# Patient Record
Sex: Male | Born: 1955 | Race: White | Hispanic: No | Marital: Single | Smoking: Current every day smoker
Health system: Southern US, Community
[De-identification: ages and names within clinical notes are randomized; demographics above are authoritative.]

## PROBLEM LIST (undated history)

## (undated) DIAGNOSIS — M1990 Unspecified osteoarthritis, unspecified site: Secondary | ICD-10-CM

## (undated) DIAGNOSIS — I10 Essential (primary) hypertension: Secondary | ICD-10-CM

## (undated) DIAGNOSIS — E785 Hyperlipidemia, unspecified: Secondary | ICD-10-CM

## (undated) DIAGNOSIS — J189 Pneumonia, unspecified organism: Secondary | ICD-10-CM

## (undated) HISTORY — DX: Hyperlipidemia, unspecified: E78.5

## (undated) HISTORY — DX: Essential (primary) hypertension: I10

## (undated) HISTORY — PX: BUNIONECTOMY: SHX129

## (undated) HISTORY — PX: KNEE ARTHROSCOPY: SUR90

---

## 2001-11-08 ENCOUNTER — Encounter: Admission: RE | Admit: 2001-11-08 | Discharge: 2001-11-08 | Payer: Self-pay | Admitting: Family Medicine

## 2001-11-08 ENCOUNTER — Encounter: Payer: Self-pay | Admitting: Family Medicine

## 2001-11-24 ENCOUNTER — Encounter: Payer: Self-pay | Admitting: Urology

## 2001-11-24 ENCOUNTER — Encounter: Admission: RE | Admit: 2001-11-24 | Discharge: 2001-11-24 | Payer: Self-pay | Admitting: Urology

## 2005-01-20 ENCOUNTER — Ambulatory Visit (HOSPITAL_COMMUNITY): Admission: RE | Admit: 2005-01-20 | Discharge: 2005-01-20 | Payer: Self-pay

## 2009-12-31 ENCOUNTER — Inpatient Hospital Stay (HOSPITAL_COMMUNITY): Admission: EM | Admit: 2009-12-31 | Discharge: 2010-01-01 | Payer: Self-pay | Admitting: Emergency Medicine

## 2009-12-31 ENCOUNTER — Ambulatory Visit: Payer: Self-pay | Admitting: Family Medicine

## 2010-01-01 ENCOUNTER — Encounter: Payer: Self-pay | Admitting: Family Medicine

## 2010-11-25 LAB — URINALYSIS, ROUTINE W REFLEX MICROSCOPIC
Glucose, UA: NEGATIVE mg/dL
Hgb urine dipstick: NEGATIVE
Ketones, ur: NEGATIVE mg/dL
Protein, ur: NEGATIVE mg/dL

## 2010-11-25 LAB — BASIC METABOLIC PANEL
BUN: 94 mg/dL — ABNORMAL HIGH (ref 6–23)
CO2: 20 mEq/L (ref 19–32)
Chloride: 110 mEq/L (ref 96–112)
Chloride: 113 mEq/L — ABNORMAL HIGH (ref 96–112)
Creatinine, Ser: 2.55 mg/dL — ABNORMAL HIGH (ref 0.4–1.5)
Creatinine, Ser: 3.08 mg/dL — ABNORMAL HIGH (ref 0.4–1.5)
GFR calc Af Amer: 32 mL/min — ABNORMAL LOW (ref >60)
GFR calc non Af Amer: 21 mL/min — ABNORMAL LOW (ref >60)
Glucose, Bld: 102 mg/dL — ABNORMAL HIGH (ref 70–99)
Potassium: 4.8 mEq/L (ref 3.5–5.1)

## 2010-11-25 LAB — RENAL FUNCTION PANEL
CO2: 20 mEq/L (ref 19–32)
Calcium: 8.9 mg/dL (ref 8.4–10.5)
Chloride: 112 mEq/L (ref 96–112)
GFR calc non Af Amer: 31 mL/min — ABNORMAL LOW (ref >60)
Glucose, Bld: 98 mg/dL (ref 70–99)
Sodium: 140 mEq/L (ref 135–145)

## 2010-11-25 LAB — DIFFERENTIAL
Basophils Absolute: 0.1 10*3/uL (ref 0.0–0.1)
Basophils Relative: 1 % (ref 0–1)
Eosinophils Absolute: 0.1 10*3/uL (ref 0.0–0.7)
Neutrophils Relative %: 70 % (ref 43–77)

## 2010-11-25 LAB — COMPREHENSIVE METABOLIC PANEL
ALT: 16 U/L (ref 0–53)
Alkaline Phosphatase: 58 U/L (ref 39–117)
CO2: 17 mEq/L — ABNORMAL LOW (ref 19–32)
Calcium: 9.7 mg/dL (ref 8.4–10.5)
GFR calc non Af Amer: 17 mL/min — ABNORMAL LOW (ref >60)
Glucose, Bld: 120 mg/dL — ABNORMAL HIGH (ref 70–99)
Potassium: 5.5 mEq/L — ABNORMAL HIGH (ref 3.5–5.1)
Sodium: 136 mEq/L (ref 135–145)

## 2010-11-25 LAB — OSMOLALITY, URINE: Osmolality, Ur: 362 mOsm/kg — ABNORMAL LOW (ref 390–1090)

## 2010-11-25 LAB — CBC
Hemoglobin: 13 g/dL (ref 13.0–17.0)
MCHC: 35.1 g/dL (ref 30.0–36.0)
RBC: 3.69 MIL/uL — ABNORMAL LOW (ref 4.22–5.81)

## 2010-11-25 LAB — POCT CARDIAC MARKERS: Myoglobin, poc: 228 ng/mL (ref 12–200)

## 2010-11-25 LAB — SODIUM, URINE, RANDOM: Sodium, Ur: 33 mEq/L

## 2010-11-25 LAB — CARDIAC PANEL(CRET KIN+CKTOT+MB+TROPI): Troponin I: 0.02 ng/mL (ref 0.00–0.06)

## 2010-11-25 LAB — URINE CULTURE: Colony Count: 15000

## 2011-12-12 ENCOUNTER — Other Ambulatory Visit: Payer: Self-pay | Admitting: Family Medicine

## 2012-01-04 ENCOUNTER — Ambulatory Visit (INDEPENDENT_AMBULATORY_CARE_PROVIDER_SITE_OTHER): Payer: BC Managed Care – PPO | Admitting: Internal Medicine

## 2012-01-04 VITALS — BP 162/87 | HR 78 | Temp 98.2°F | Resp 16 | Ht 70.5 in | Wt 262.2 lb

## 2012-01-04 DIAGNOSIS — R739 Hyperglycemia, unspecified: Secondary | ICD-10-CM

## 2012-01-04 DIAGNOSIS — E785 Hyperlipidemia, unspecified: Secondary | ICD-10-CM

## 2012-01-04 DIAGNOSIS — Z6837 Body mass index (BMI) 37.0-37.9, adult: Secondary | ICD-10-CM | POA: Insufficient documentation

## 2012-01-04 DIAGNOSIS — R7309 Other abnormal glucose: Secondary | ICD-10-CM

## 2012-01-04 DIAGNOSIS — I10 Essential (primary) hypertension: Secondary | ICD-10-CM | POA: Insufficient documentation

## 2012-01-04 LAB — POCT CBC
HCT, POC: 45.3 % (ref 43.5–53.7)
Lymph, poc: 1.9 (ref 0.6–3.4)
MCH, POC: 32.2 pg — AB (ref 27–31.2)
MCHC: 32.2 g/dL (ref 31.8–35.4)
MCV: 100.1 fL — AB (ref 80–97)
MID (cbc): 1 — AB (ref 0–0.9)
POC LYMPH PERCENT: 13 %L (ref 10–50)
Platelet Count, POC: 316 10*3/uL (ref 142–424)
RDW, POC: 14 %
WBC: 14.9 10*3/uL — AB (ref 4.6–10.2)

## 2012-01-04 LAB — COMPREHENSIVE METABOLIC PANEL
AST: 18 U/L (ref 0–37)
Albumin: 4.4 g/dL (ref 3.5–5.2)
Alkaline Phosphatase: 100 U/L (ref 39–117)
BUN: 18 mg/dL (ref 6–23)
Creat: 1 mg/dL (ref 0.50–1.35)
Glucose, Bld: 114 mg/dL — ABNORMAL HIGH (ref 70–99)

## 2012-01-04 LAB — LIPID PANEL
HDL: 56 mg/dL (ref >39)
LDL Cholesterol: 156 mg/dL — ABNORMAL HIGH (ref 0–99)
Total CHOL/HDL Ratio: 4.2 Ratio
Triglycerides: 106 mg/dL (ref <150)

## 2012-01-04 LAB — POCT GLYCOSYLATED HEMOGLOBIN (HGB A1C): Hemoglobin A1C: 5.4

## 2012-01-04 LAB — PSA: PSA: 1.43 ng/mL (ref <=4.00)

## 2012-01-04 MED ORDER — AMLODIPINE BESYLATE 5 MG PO TABS: 5.0000 mg | ORAL_TABLET | Freq: Every day | ORAL | Status: DC

## 2012-01-04 MED ORDER — METOPROLOL SUCCINATE ER 50 MG PO TB24: 50.0000 mg | ORAL_TABLET | Freq: Every day | ORAL | Status: DC

## 2012-01-04 NOTE — Progress Notes (Signed)
  Subjective:    Patient ID: Raymond Moreno, male    DOB: Nov 05, 1955, 56 y.o.   MRN: [id number]  HPI Patient Active Problem List  Diagnoses  . BMI 37.0-37.9,adult  . HTN (hypertension)  . Hyperlipemia  Here for followup and med refills Last labs in May of 2011 Followup has been sporadic No complaints today   Social history: Continues to work for [employer]  Review of Systems  Constitutional: Negative for activity change, appetite change and fatigue.  HENT: Negative for hearing loss.   Eyes: Negative for visual disturbance.  Respiratory: Negative for cough, chest tightness and shortness of breath.        Continues to smoke Not ready to quit  Cardiovascular: Negative for chest pain, palpitations and leg swelling.  Gastrointestinal: Negative for abdominal pain, diarrhea and constipation.  Genitourinary: Negative for frequency and difficulty urinating.       Objective:   Physical Exam  Constitutional: He is oriented to person, place, and time.       BMI 37  Eyes: Conjunctivae and EOM are normal. Pupils are equal, round, and reactive to light.  Neck: No thyromegaly present.  Cardiovascular: Normal rate, regular rhythm, normal heart sounds and intact distal pulses.   No murmur heard. Pulmonary/Chest: Effort normal and breath sounds normal.  Musculoskeletal: He exhibits no edema.  Lymphadenopathy:    He has no cervical adenopathy.  Neurological: He is alert and oriented to person, place, and time.          Results for orders placed in visit on 01/04/12  POCT GLYCOSYLATED HEMOGLOBIN (HGB A1C)      Component Value Range   Hemoglobin A1C 5.4    POCT CBC      Component Value Range   WBC 14.9 (*) 4.6 - 10.2 (K/uL)   Lymph, poc 1.9  0.6 - 3.4    POC LYMPH PERCENT 13.0  10 - 50 (%L)   MID (cbc) 1.0 (*) 0 - 0.9    POC MID % 6.6  0 - 12 (%M)   POC Granulocyte 12.0 (*) 2 - 6.9    Granulocyte percent 80.4 (*) 37 - 80 (%G)   RBC 4.53 (*) 4.69 - 6.13 (M/uL)   Hemoglobin 14.6   14.1 - 18.1 (g/dL)   HCT, POC 40.9  81.1 - 53.7 (%)   MCV 100.1 (*) 80 - 97 (fL)   MCH, POC 32.2 (*) 27 - 31.2 (pg)   MCHC 32.2  31.8 - 35.4 (g/dL)   RDW, POC 91.4     Platelet Count, POC 316  142 - 424 (K/uL)   MPV 7.9  0 - 99.8 (fL)    Assessment & Plan:   Patient Active Problem List  Diagnoses  . BMI 37.0-37.9,adult  . HTN (hypertension)  . Hyperlipemia    -  BMI 37   - Hx Hyperglycemia-Hemoglobin A1c normal  Routine labs including PSA ordered Amlodipine and metoprolol refills He needs a routine physical examination this year and consideration for health maintenance issues like colonoscopy Recommend significant weight loss and cessation of smoking

## 2012-01-06 ENCOUNTER — Encounter: Payer: Self-pay | Admitting: Internal Medicine

## 2012-07-07 ENCOUNTER — Other Ambulatory Visit: Payer: Self-pay | Admitting: Internal Medicine

## 2012-08-20 ENCOUNTER — Other Ambulatory Visit: Payer: Self-pay | Admitting: Internal Medicine

## 2012-08-20 NOTE — Telephone Encounter (Signed)
Needs OV, was due for CPE in Oct 2013

## 2012-08-21 ENCOUNTER — Other Ambulatory Visit: Payer: Self-pay | Admitting: Physician Assistant

## 2012-09-27 ENCOUNTER — Ambulatory Visit (INDEPENDENT_AMBULATORY_CARE_PROVIDER_SITE_OTHER): Payer: BC Managed Care – PPO | Admitting: Family Medicine

## 2012-09-27 VITALS — BP 177/90 | HR 69 | Temp 98.4°F | Resp 16 | Ht 72.5 in | Wt 269.6 lb

## 2012-09-27 DIAGNOSIS — I10 Essential (primary) hypertension: Secondary | ICD-10-CM

## 2012-09-27 DIAGNOSIS — Z23 Encounter for immunization: Secondary | ICD-10-CM

## 2012-09-27 DIAGNOSIS — Z0000 Encounter for general adult medical examination without abnormal findings: Secondary | ICD-10-CM

## 2012-09-27 LAB — POCT URINALYSIS DIPSTICK
Bilirubin, UA: NEGATIVE
Glucose, UA: NEGATIVE
Ketones, UA: NEGATIVE
Leukocytes, UA: NEGATIVE
Nitrite, UA: NEGATIVE
Spec Grav, UA: 1.02
Urobilinogen, UA: 0.2
pH, UA: 7

## 2012-09-27 LAB — COMPREHENSIVE METABOLIC PANEL
ALT: 13 U/L (ref 0–53)
AST: 17 U/L (ref 0–37)
Albumin: 4.5 g/dL (ref 3.5–5.2)
Alkaline Phosphatase: 87 U/L (ref 39–117)
BUN: 13 mg/dL (ref 6–23)
CO2: 28 mEq/L (ref 19–32)
Calcium: 9.8 mg/dL (ref 8.4–10.5)
Chloride: 106 mEq/L (ref 96–112)
Creat: 0.87 mg/dL (ref 0.50–1.35)
Glucose, Bld: 108 mg/dL — ABNORMAL HIGH (ref 70–99)
Potassium: 4.3 mEq/L (ref 3.5–5.3)
Sodium: 142 mEq/L (ref 135–145)
Total Bilirubin: 0.4 mg/dL (ref 0.3–1.2)
Total Protein: 7.4 g/dL (ref 6.0–8.3)

## 2012-09-27 LAB — LIPID PANEL
Cholesterol: 272 mg/dL — ABNORMAL HIGH (ref 0–200)
HDL: 60 mg/dL (ref >39)
LDL Cholesterol: 180 mg/dL — ABNORMAL HIGH (ref 0–99)
Total CHOL/HDL Ratio: 4.5 Ratio
Triglycerides: 162 mg/dL — ABNORMAL HIGH (ref <150)
VLDL: 32 mg/dL (ref 0–40)

## 2012-09-27 MED ORDER — METOPROLOL SUCCINATE ER 50 MG PO TB24: 50.0000 mg | ORAL_TABLET | Freq: Every day | ORAL | Status: DC

## 2012-09-27 MED ORDER — AMLODIPINE BESYLATE 5 MG PO TABS: ORAL_TABLET | ORAL | Status: DC

## 2012-09-27 NOTE — Patient Instructions (Signed)
Hypertension As your heart beats, it forces blood through your arteries. This force is your blood pressure. If the pressure is too high, it is called hypertension (HTN) or high blood pressure. HTN is dangerous because you may have it and not know it. High blood pressure may mean that your heart has to work harder to pump blood. Your arteries may be narrow or stiff. The extra work puts you at risk for heart disease, stroke, and other problems.  Blood pressure consists of two numbers, a higher number over a lower, 110/72, for example. It is stated as "110 over 72." The ideal is below 120 for the top number (systolic) and under 80 for the bottom (diastolic). Write down your blood pressure today. You should pay close attention to your blood pressure if you have certain conditions such as:  Heart failure.  Prior heart attack.  Diabetes  Chronic kidney disease.  Prior stroke.  Multiple risk factors for heart disease. To see if you have HTN, your blood pressure should be measured while you are seated with your arm held at the level of the heart. It should be measured at least twice. A one-time elevated blood pressure reading (especially in the Emergency Department) does not mean that you need treatment. There may be conditions in which the blood pressure is different between your right and left arms. It is important to see your caregiver soon for a recheck. Most people have essential hypertension which means that there is not a specific cause. This type of high blood pressure may be lowered by changing lifestyle factors such as:  Stress.  Smoking.  Lack of exercise.  Excessive weight.  Drug/tobacco/alcohol use.  Eating less salt. Most people do not have symptoms from high blood pressure until it has caused damage to the body. Effective treatment can often prevent, delay or reduce that damage. TREATMENT  When a cause has been identified, treatment for high blood pressure is directed at the  cause. There are a large number of medications to treat HTN. These fall into several categories, and your caregiver will help you select the medicines that are best for you. Medications may have side effects. You should review side effects with your caregiver. If your blood pressure stays high after you have made lifestyle changes or started on medicines,   Your medication(s) may need to be changed.  Other problems may need to be addressed.  Be certain you understand your prescriptions, and know how and when to take your medicine.  Be sure to follow up with your caregiver within the time frame advised (usually within two weeks) to have your blood pressure rechecked and to review your medications.  If you are taking more than one medicine to lower your blood pressure, make sure you know how and at what times they should be taken. Taking two medicines at the same time can result in blood pressure that is too low. SEEK IMMEDIATE MEDICAL CARE IF:  You develop a severe headache, blurred or changing vision, or confusion.  You have unusual weakness or numbness, or a faint feeling.  You have severe chest or abdominal pain, vomiting, or breathing problems. MAKE SURE YOU:   Understand these instructions.  Will watch your condition.  Will get help right away if you are not doing well or get worse. Document Released: 08/24/2005 Document Revised: 11/16/2011 Document Reviewed: 04/13/2008 Exit[employer] Patient Information 2013 [employer]. Health Maintenance, Males A healthy lifestyle and preventative care can promote health and wellness.  Maintain   regular health, dental, and eye exams.  Eat a healthy diet. Foods like vegetables, fruits, whole grains, low-fat dairy products, and lean protein foods contain the nutrients you need without too many calories. Decrease your intake of foods high in solid fats, added sugars, and salt. Get information about a proper diet from your caregiver, if  necessary.  Regular physical exercise is one of the most important things you can do for your health. Most adults should get at least 150 minutes of moderate-intensity exercise (any activity that increases your heart rate and causes you to sweat) each week. In addition, most adults need muscle-strengthening exercises on 2 or more days a week.   Maintain a healthy weight. The body mass index (BMI) is a screening tool to identify possible weight problems. It provides an estimate of body fat based on height and weight. Your caregiver can help determine your BMI, and can help you achieve or maintain a healthy weight. For adults 20 years and older:  A BMI below 18.5 is considered underweight.  A BMI of 18.5 to 24.9 is normal.  A BMI of 25 to 29.9 is considered overweight.  A BMI of 30 and above is considered obese.  Maintain normal blood lipids and cholesterol by exercising and minimizing your intake of saturated fat. Eat a balanced diet with plenty of fruits and vegetables. Blood tests for lipids and cholesterol should begin at age 20 and be repeated every 5 years. If your lipid or cholesterol levels are high, you are over 50, or you are a high risk for heart disease, you may need your cholesterol levels checked more frequently.Ongoing high lipid and cholesterol levels should be treated with medicines, if diet and exercise are not effective.  If you smoke, find out from your caregiver how to quit. If you do not use tobacco, do not start.  If you choose to drink alcohol, do not exceed 2 drinks per day. One drink is considered to be 12 ounces (355 mL) of beer, 5 ounces (148 mL) of wine, or 1.5 ounces (44 mL) of liquor.  Avoid use of street drugs. Do not share needles with anyone. Ask for help if you need support or instructions about stopping the use of drugs.  High blood pressure causes heart disease and increases the risk of stroke. Blood pressure should be checked at least every 1 to 2 years.  Ongoing high blood pressure should be treated with medicines if weight loss and exercise are not effective.  If you are 45 to 57 years old, ask your caregiver if you should take aspirin to prevent heart disease.  Diabetes screening involves taking a blood sample to check your fasting blood sugar level. This should be done once every 3 years, after age 45, if you are within normal weight and without risk factors for diabetes. Testing should be considered at a younger age or be carried out more frequently if you are overweight and have at least 1 risk factor for diabetes.  Colorectal cancer can be detected and often prevented. Most routine colorectal cancer screening begins at the age of 50 and continues through age 75. However, your caregiver may recommend screening at an earlier age if you have risk factors for colon cancer. On a yearly basis, your caregiver may provide home test kits to check for hidden blood in the stool. Use of a small camera at the end of a tube, to directly examine the colon (sigmoidoscopy or colonoscopy), can detect the earliest forms of colorectal   cancer. Talk to your caregiver about this at age 50, when routine screening begins. Direct examination of the colon should be repeated every 5 to 10 years through age 75, unless early forms of pre-cancerous polyps or small growths are found.  Hepatitis C blood testing is recommended for all people born from 1945 through 1965 and any individual with known risks for hepatitis C.  Healthy men should no longer receive prostate-specific antigen (PSA) blood tests as part of routine cancer screening. Consult with your caregiver about prostate cancer screening.  Testicular cancer screening is not recommended for adolescents or adult males who have no symptoms. Screening includes self-exam, caregiver exam, and other screening tests. Consult with your caregiver about any symptoms you have or any concerns you have about testicular  cancer.  Practice safe sex. Use condoms and avoid high-risk sexual practices to reduce the spread of sexually transmitted infections (STIs).  Use sunscreen with a sun protection factor (SPF) of 30 or greater. Apply sunscreen liberally and repeatedly throughout the day. You should seek shade when your shadow is shorter than you. Protect yourself by wearing long sleeves, pants, a wide-brimmed hat, and sunglasses year round, whenever you are outdoors.  Notify your caregiver of new moles or changes in moles, especially if there is a change in shape or color. Also notify your caregiver if a mole is larger than the size of a pencil eraser.  A one-time screening for abdominal aortic aneurysm (AAA) and surgical repair of large AAAs by sound wave imaging (ultrasonography) is recommended for ages 65 to 75 years who are current or former smokers.  Stay current with your immunizations. Document Released: 02/20/2008 Document Revised: 11/16/2011 Document Reviewed: 01/19/2011 Exit[employer] Patient Information 2013 [employer].  

## 2012-09-27 NOTE — Progress Notes (Signed)
Patient ID: Raymond Moreno MRN: [id number], DOB: 05/03/1956 57 y.o. Date of Encounter: 09/27/2012, 9:41 AM  Primary Physician: No primary provider on file.  Chief Complaint: Physical (CPE)  HPI: 57 y.o. y/o male with history noted below here for CPE.  Doing well. No issues/complaints.  Review of Systems: Consitutional: No fever, chills, fatigue, night sweats, lymphadenopathy, or weight changes. Eyes: No visual changes, eye redness, or discharge. ENT/Mouth: Ears: No otalgia, tinnitus, hearing loss, discharge. Nose: No congestion, rhinorrhea, sinus pain, or epistaxis. Throat: No sore throat, post nasal drip, or teeth pain. Cardiovascular: No CP, palpitations, diaphoresis, DOE, edema, orthopnea, PND. Respiratory: No cough, hemoptysis, SOB, or wheezing. Gastrointestinal: No anorexia, dysphagia, reflux, pain, nausea, vomiting, hematemesis, diarrhea, constipation, BRBPR, or melena. Genitourinary: No dysuria, frequency, urgency, hematuria, incontinence, nocturia, decreased urinary stream, discharge, impotence, or testicular pain/masses. Musculoskeletal: No decreased ROM, myalgias, stiffness, joint swelling, or weakness. Skin: No rash, erythema, lesion changes, pain, warmth, jaundice, or pruritis. Neurological: No headache, dizziness, syncope, seizures, tremors, memory loss, coordination problems, or paresthesias. Psychological: No anxiety, depression, hallucinations, SI/HI. Endocrine: No fatigue, polydipsia, polyphagia, polyuria, or known diabetes. All other systems were reviewed and are otherwise negative.  Past Medical History  Diagnosis Date  . Hypertension      Past Surgical History  Procedure Date  . Bunionectomy     left foot  . Knee arthroscopy     right knee    Home Meds:  Prior to Admission medications   Medication Sig Start Date End Date Taking? Authorizing Provider  amLODipine (NORVASC) 5 MG tablet One daily 09/27/12  Yes Elvina Sidle, MD  aspirin 81 MG tablet  Take 81 mg by mouth daily.   Yes Historical Provider, MD  metoprolol succinate (TOPROL-XL) 50 MG 24 hr tablet Take 1 tablet (50 mg total) by mouth daily. Need office visit for additional refills. 09/27/12  Yes Elvina Sidle, MD  Omega-3 Fatty Acids (FISH OIL) 1000 MG CAPS Take 2 capsules by mouth daily.   Yes Historical Provider, MD    Allergies: No Known Allergies  History   Social History  . Marital Status: Married    Spouse Name: N/A    Number of Children: N/A  . Years of Education: N/A   Occupational History  . Not on file.   Social History Main Topics  . Smoking status: Current Every Day Smoker  . Smokeless tobacco: Not on file  . Alcohol Use: Yes  . Drug Use: No  . Sexually Active: No   Other Topics Concern  . Not on file   Social History Narrative  . No narrative on file    Family History  Problem Relation Age of Onset  . Heart disease Mother   . Hypertension Sister     Physical Exam:  158/82 Blood pressure 177/90, pulse 69, temperature 98.4 F (36.9 C), temperature source Oral, resp. rate 16, height 6' 0.5" (1.842 m), weight 269 lb 9.6 oz (122.29 kg), SpO2 96.00%.  General: Well developed, well nourished, in no acute distress. HEENT: Normocephalic, atraumatic. Conjunctiva pink, sclera non-icteric. Pupils 2 mm constricting to 1 mm, round, regular, and equally reactive to light and accomodation. EOMI. Internal auditory canal clear. TMs with good cone of light and without pathology. Nasal mucosa pink. Nares are without discharge. No sinus tenderness. Oral mucosa pink. Dentition multiple caries with gingivitis. Pharynx without exudate.   Neck: Supple. Trachea midline. No thyromegaly. Full ROM. No lymphadenopathy. Lungs: Clear to auscultation bilaterally without wheezes, rales, or rhonchi. Breathing is  of normal effort and unlabored. Cardiovascular: RRR with S1 S2. No murmurs, rubs, or gallops appreciated. Distal pulses 2+ symmetrically. No carotid or abdominal  bruits Abdomen: Soft, non-tender, non-distended with normoactive bowel sounds. No hepatosplenomegaly or masses. No rebound/guarding. No CVA tenderness. Without hernias.   Genitourinary:  circumcised male. No penile lesions. Testes descended bilaterally, and smooth without tenderness or masses.  Musculoskeletal: Full range of motion and 5/5 strength throughout. Without swelling, atrophy, tenderness, crepitus, or warmth. Extremities without clubbing, cyanosis, or edema. Calves supple. Skin: Warm and moist without erythema, ecchymosis, wounds, or rash. Neuro: A+Ox3. CN II-XII grossly intact. Moves all extremities spontaneously. Full sensation throughout. Normal gait. DTR 2+ throughout upper and lower extremities. Finger to nose intact. Psych:  Responds to questions appropriately with a normal affect.   Studies: CBC, CMET, Lipid, PSA UA:   Assessment/Plan:  57 y.o. y/o  male here for CPE -  Signed, Elvina Sidle, MD 09/27/2012 9:41 AM

## 2012-09-28 ENCOUNTER — Telehealth: Payer: Self-pay

## 2012-09-28 NOTE — Telephone Encounter (Signed)
Pt notified. Says he has taken Simvastatin in the past and is ok with taking it again. Please send to pharm. thanks

## 2012-09-29 ENCOUNTER — Other Ambulatory Visit: Payer: Self-pay | Admitting: Family Medicine

## 2012-09-29 DIAGNOSIS — E785 Hyperlipidemia, unspecified: Secondary | ICD-10-CM

## 2012-09-29 MED ORDER — SIMVASTATIN 20 MG PO TABS: 20.0000 mg | ORAL_TABLET | Freq: Every day | ORAL | Status: DC

## 2013-10-04 ENCOUNTER — Other Ambulatory Visit: Payer: Self-pay | Admitting: Family Medicine

## 2013-10-05 ENCOUNTER — Other Ambulatory Visit: Payer: Self-pay | Admitting: Family Medicine

## 2013-10-20 ENCOUNTER — Ambulatory Visit: Payer: BC Managed Care – PPO

## 2013-10-20 ENCOUNTER — Ambulatory Visit (INDEPENDENT_AMBULATORY_CARE_PROVIDER_SITE_OTHER): Payer: BC Managed Care – PPO | Admitting: Emergency Medicine

## 2013-10-20 VITALS — BP 128/72 | HR 72 | Temp 98.2°F | Resp 18 | Ht 72.5 in | Wt 260.0 lb

## 2013-10-20 DIAGNOSIS — M25539 Pain in unspecified wrist: Secondary | ICD-10-CM

## 2013-10-20 DIAGNOSIS — S63502A Unspecified sprain of left wrist, initial encounter: Secondary | ICD-10-CM

## 2013-10-20 DIAGNOSIS — S63509A Unspecified sprain of unspecified wrist, initial encounter: Secondary | ICD-10-CM

## 2013-10-20 MED ORDER — HYDROCODONE-ACETAMINOPHEN 5-325 MG PO TABS: 1.0000 | ORAL_TABLET | Freq: Four times a day (QID) | ORAL | Status: DC | PRN

## 2013-10-20 NOTE — Progress Notes (Signed)
   Subjective:    Patient ID: Raymond Moreno, male    DOB: Jul 10, 1956, 58 y.o.   MRN: [id number]  HPI patient was in his usual state of health until early morning when he fell and sustained a injury to his left wrist. He now is unable to grip or move his wrist without significant pain. He is noticed significant swelling over the wrist.    Review of Systems     Objective:   Physical Exam there is significant discomfort with flexion or extension of the wrist. There is significant swelling over the scaphoid as well as over the wrist itself. Neurovascular is intact the patient is able to flex extend the fingers and there is normal filling to  [facility] reading (PRIMARY) by  Dr. Everlene Farrier.There is a tubercle present over the scaphoid. I did not see a definite fracture patient placed in a sugar tong        Assessment & Plan:   Referral made to go for orthopedics. Devra Dopp requested regarding the wrist films. He is given hydrocodone for pain. Note for work given.

## 2013-11-22 ENCOUNTER — Ambulatory Visit (INDEPENDENT_AMBULATORY_CARE_PROVIDER_SITE_OTHER): Payer: BC Managed Care – PPO | Admitting: Family Medicine

## 2013-11-22 VITALS — BP 180/96 | HR 87 | Temp 98.0°F | Resp 18 | Ht 72.05 in | Wt 261.0 lb

## 2013-11-22 DIAGNOSIS — Z91199 Patient's noncompliance with other medical treatment and regimen due to unspecified reason: Secondary | ICD-10-CM

## 2013-11-22 DIAGNOSIS — E785 Hyperlipidemia, unspecified: Secondary | ICD-10-CM

## 2013-11-22 DIAGNOSIS — I10 Essential (primary) hypertension: Secondary | ICD-10-CM

## 2013-11-22 DIAGNOSIS — Z9119 Patient's noncompliance with other medical treatment and regimen: Secondary | ICD-10-CM

## 2013-11-22 LAB — COMPREHENSIVE METABOLIC PANEL
ALT: 11 U/L (ref 0–53)
Albumin: 4.1 g/dL (ref 3.5–5.2)
BUN: 13 mg/dL (ref 6–23)
CO2: 25 mEq/L (ref 19–32)
Calcium: 9.2 mg/dL (ref 8.4–10.5)
Glucose, Bld: 95 mg/dL (ref 70–99)
Potassium: 4 mEq/L (ref 3.5–5.3)
Sodium: 141 mEq/L (ref 135–145)
Total Bilirubin: 0.4 mg/dL (ref 0.2–1.2)
Total Protein: 7.1 g/dL (ref 6.0–8.3)

## 2013-11-22 LAB — COMPREHENSIVE METABOLIC PANEL WITH GFR
AST: 19 U/L (ref 0–37)
Alkaline Phosphatase: 88 U/L (ref 39–117)
Chloride: 103 meq/L (ref 96–112)
Creat: 0.73 mg/dL (ref 0.50–1.35)

## 2013-11-22 LAB — LIPID PANEL
Cholesterol: 260 mg/dL — ABNORMAL HIGH (ref 0–200)
HDL: 60 mg/dL (ref >39)
LDL Cholesterol: 164 mg/dL — ABNORMAL HIGH (ref 0–99)
Total CHOL/HDL Ratio: 4.3 ratio
Triglycerides: 179 mg/dL — ABNORMAL HIGH (ref <150)
VLDL: 36 mg/dL (ref 0–40)

## 2013-11-22 LAB — TSH: TSH: 1.54 u[IU]/mL (ref 0.350–4.500)

## 2013-11-22 MED ORDER — SIMVASTATIN 20 MG PO TABS: 20.0000 mg | ORAL_TABLET | Freq: Every day | ORAL | Status: DC

## 2013-11-22 MED ORDER — METOPROLOL SUCCINATE ER 50 MG PO TB24: 50.0000 mg | ORAL_TABLET | Freq: Every day | ORAL | Status: DC

## 2013-11-22 MED ORDER — AMLODIPINE BESYLATE 5 MG PO TABS: 5.0000 mg | ORAL_TABLET | Freq: Every day | ORAL | Status: DC

## 2013-11-22 NOTE — Progress Notes (Signed)
Chief Complaint:  Chief Complaint  Patient presents with  . Medication Refill    needs blood work- he is fasting    HPI: Raymond Moreno is a 58 y.o. male who is here for refills of his medication. He needs refills of metoprolol, amlodipine, simvastatin. No SEs He is compliant. He does not take the simvastatin regular, he takes it about 4 times a week at most sicne he has to take it at night and is forgetful.  He is a smoker. Did not eat today. Ran out of meds.   Past Medical History  Diagnosis Date  . Hypertension   . Hyperlipidemia    Past Surgical History  Procedure Laterality Date  . Bunionectomy      left foot  . Knee arthroscopy      right knee   History   Social History  . Marital Status: Married    Spouse Name: N/A    Number of Children: N/A  . Years of Education: N/A   Social History Main Topics  . Smoking status: Current Every Day Smoker  . Smokeless tobacco: None  . Alcohol Use: Yes  . Drug Use: No  . Sexual Activity: No   Other Topics Concern  . None   Social History Narrative  . None   Family History  Problem Relation Age of Onset  . Heart disease Mother   . Hypertension Sister    No Known Allergies Prior to Admission medications   Medication Sig Start Date End Date Taking? Authorizing Provider  amLODipine (NORVASC) 5 MG tablet Take 1 tablet (5 mg total) by mouth daily. PATIENT NEEDS OFFICE VISIT FOR ADDITIONAL REFILLS 10/05/13  Yes Robyn Haber, MD  aspirin 81 MG tablet Take 81 mg by mouth daily.   Yes Historical Provider, MD  metoprolol succinate (TOPROL-XL) 50 MG 24 hr tablet Take 1 tablet (50 mg total) by mouth daily. PATIENT NEEDS OFFICE VISIT FOR ADDITIONAL REFILLS 10/05/13  Yes Robyn Haber, MD  simvastatin (ZOCOR) 20 MG tablet Take 1 tablet (20 mg total) by mouth at bedtime. PATIENT NEEDS OFFICE VISIT FOR ADDITIONAL REFILLS 10/04/13  Yes Eleanore Kurtis Bushman, PA-C     ROS: The patient denies fevers, chills, night sweats,  unintentional weight loss, chest pain, palpitations, wheezing, dyspnea on exertion, nausea, vomiting, abdominal pain, dysuria, hematuria, melena, numbness, weakness, or tingling.   All other systems have been reviewed and were otherwise negative with the exception of those mentioned in the HPI and as above.    PHYSICAL EXAM: Filed Vitals:   11/22/13 0905  BP: 180/96  Pulse: 87  Temp: 98 F (36.7 C)  Resp: 18   Filed Vitals:   11/22/13 0905  Height: 6' 0.05" (1.83 m)  Weight: 261 lb (118.389 kg)   Body mass index is 35.35 kg/(m^2).  General: Alert, no acute distress, obese HEENT:  Normocephalic, atraumatic, oropharynx patent. EOMI, PERRLA, tm nl, no exudates Cardiovascular:  Regular rate and rhythm, no rubs murmurs or gallops.  No Carotid bruits, radial pulse intact. No pedal edema.  Respiratory: Clear to auscultation bilaterally.  No wheezes, rales, or rhonchi.  No cyanosis, no use of accessory musculature GI: No organomegaly, abdomen is soft and non-tender, positive bowel sounds.  No masses. Skin: No rashes. Neurologic: Facial musculature symmetric. Psychiatric: Patient is appropriate throughout our interaction. Lymphatic: No cervical lymphadenopathy Musculoskeletal: Gait intact.   LABS: Results for orders placed in visit on 11/22/13  COMPREHENSIVE METABOLIC PANEL  Result Value Ref Range   Sodium 141  135 - 145 mEq/L   Potassium 4.0  3.5 - 5.3 mEq/L   Chloride 103  96 - 112 mEq/L   CO2 25  19 - 32 mEq/L   Glucose, Bld 95  70 - 99 mg/dL   BUN 13  6 - 23 mg/dL   Creat 0.73  0.50 - 1.35 mg/dL   Total Bilirubin 0.4  0.2 - 1.2 mg/dL   Alkaline Phosphatase 88  39 - 117 U/L   AST 19  0 - 37 U/L   ALT 11  0 - 53 U/L   Total Protein 7.1  6.0 - 8.3 g/dL   Albumin 4.1  3.5 - 5.2 g/dL   Calcium 9.2  8.4 - 10.5 mg/dL  TSH      Result Value Ref Range   TSH 1.540  0.350 - 4.500 uIU/mL  MICROALBUMIN / CREATININE URINE RATIO      Result Value Ref Range   Microalb, Ur 9.94  (*) 0.00 - 1.89 mg/dL   Creatinine, Urine 178.2     Microalb Creat Ratio 55.8 (*) 0.0 - 30.0 mg/g  LIPID PANEL      Result Value Ref Range   Cholesterol 260 (*) 0 - 200 mg/dL   Triglycerides 179 (*) <150 mg/dL   HDL 60  >39 mg/dL   Total CHOL/HDL Ratio 4.3     VLDL 36  0 - 40 mg/dL   LDL Cholesterol 164 (*) 0 - 99 mg/dL     EKG/XRAY:   Primary read interpreted by Dr. Marin Comment at [facility]   ASSESSMENT/PLAN: Encounter Diagnoses  Name Primary?  . HTN (hypertension) Yes  . Other and unspecified hyperlipidemia   . Non-compliant patient    Refilled meds Advise to take meds as prescribed Monitor BP , goal is less than 150/90 F/u in 6 month Labs pending  Gross sideeffects, risk and benefits, and alternatives of medications d/w patient. Patient is aware that all medications have potential sideeffects and we are unable to predict every sideeffect or drug-drug interaction that may occur.  LE, [place], DO 11/25/2013 11:46 PM

## 2013-11-22 NOTE — Patient Instructions (Signed)

## 2013-11-23 LAB — MICROALBUMIN / CREATININE URINE RATIO
Creatinine, Urine: 178.2 mg/dL
Microalb Creat Ratio: 55.8 mg/g — ABNORMAL HIGH (ref 0.0–30.0)
Microalb, Ur: 9.94 mg/dL — ABNORMAL HIGH (ref 0.00–1.89)

## 2013-11-25 ENCOUNTER — Encounter: Payer: Self-pay | Admitting: Family Medicine

## 2014-11-26 ENCOUNTER — Other Ambulatory Visit: Payer: Self-pay | Admitting: Family Medicine

## 2014-12-30 ENCOUNTER — Other Ambulatory Visit: Payer: Self-pay | Admitting: Family Medicine

## 2015-01-03 ENCOUNTER — Other Ambulatory Visit: Payer: Self-pay | Admitting: Family Medicine

## 2015-01-07 ENCOUNTER — Other Ambulatory Visit: Payer: Self-pay | Admitting: *Deleted

## 2015-01-07 ENCOUNTER — Ambulatory Visit (INDEPENDENT_AMBULATORY_CARE_PROVIDER_SITE_OTHER): Payer: BC Managed Care – PPO | Admitting: Internal Medicine

## 2015-01-07 VITALS — BP 124/78 | HR 70 | Temp 98.2°F | Resp 17 | Ht 72.5 in | Wt 258.0 lb

## 2015-01-07 DIAGNOSIS — F17200 Nicotine dependence, unspecified, uncomplicated: Secondary | ICD-10-CM | POA: Insufficient documentation

## 2015-01-07 DIAGNOSIS — H5462 Unqualified visual loss, left eye, normal vision right eye: Secondary | ICD-10-CM | POA: Diagnosis not present

## 2015-01-07 DIAGNOSIS — R251 Tremor, unspecified: Secondary | ICD-10-CM | POA: Insufficient documentation

## 2015-01-07 DIAGNOSIS — Z125 Encounter for screening for malignant neoplasm of prostate: Secondary | ICD-10-CM

## 2015-01-07 DIAGNOSIS — Z720 Tobacco use: Secondary | ICD-10-CM | POA: Diagnosis not present

## 2015-01-07 DIAGNOSIS — E785 Hyperlipidemia, unspecified: Secondary | ICD-10-CM | POA: Diagnosis not present

## 2015-01-07 DIAGNOSIS — I10 Essential (primary) hypertension: Secondary | ICD-10-CM

## 2015-01-07 DIAGNOSIS — Z6837 Body mass index (BMI) 37.0-37.9, adult: Secondary | ICD-10-CM

## 2015-01-07 LAB — CBC WITH DIFFERENTIAL/PLATELET
BASOS PCT: 1 % (ref 0–1)
Basophils Absolute: 0.1 10*3/uL (ref 0.0–0.1)
Eosinophils Absolute: 0.1 10*3/uL (ref 0.0–0.7)
Eosinophils Relative: 1 % (ref 0–5)
HEMATOCRIT: 44.4 % (ref 39.0–52.0)
HEMOGLOBIN: 15 g/dL (ref 13.0–17.0)
Lymphocytes Relative: 21 % (ref 12–46)
Lymphs Abs: 2.1 10*3/uL (ref 0.7–4.0)
MCH: 34.6 pg — ABNORMAL HIGH (ref 26.0–34.0)
MCHC: 33.8 g/dL (ref 30.0–36.0)
MCV: 102.5 fL — ABNORMAL HIGH (ref 78.0–100.0)
MONOS PCT: 7 % (ref 3–12)
MPV: 9.5 fL (ref 8.6–12.4)
Monocytes Absolute: 0.7 10*3/uL (ref 0.1–1.0)
NEUTROS PCT: 70 % (ref 43–77)
Neutro Abs: 7.1 10*3/uL (ref 1.7–7.7)
Platelets: 307 10*3/uL (ref 150–400)
RBC: 4.33 MIL/uL (ref 4.22–5.81)
RDW: 15.1 % (ref 11.5–15.5)
WBC: 10.1 10*3/uL (ref 4.0–10.5)

## 2015-01-07 LAB — LIPID PANEL
Cholesterol: 235 mg/dL — ABNORMAL HIGH (ref 0–200)
HDL: 68 mg/dL (ref >=40)
LDL Cholesterol: 142 mg/dL — ABNORMAL HIGH (ref 0–99)
Total CHOL/HDL Ratio: 3.5 Ratio
Triglycerides: 124 mg/dL (ref <150)
VLDL: 25 mg/dL (ref 0–40)

## 2015-01-07 LAB — COMPREHENSIVE METABOLIC PANEL
ALT: 13 U/L (ref 0–53)
AST: 21 U/L (ref 0–37)
Albumin: 4.3 g/dL (ref 3.5–5.2)
Alkaline Phosphatase: 92 U/L (ref 39–117)
BILIRUBIN TOTAL: 0.4 mg/dL (ref 0.2–1.2)
BUN: 28 mg/dL — AB (ref 6–23)
CALCIUM: 9.2 mg/dL (ref 8.4–10.5)
CO2: 24 meq/L (ref 19–32)
CREATININE: 1.07 mg/dL (ref 0.50–1.35)
Chloride: 103 mEq/L (ref 96–112)
GLUCOSE: 82 mg/dL (ref 70–99)
POTASSIUM: 4.9 meq/L (ref 3.5–5.3)
Sodium: 142 mEq/L (ref 135–145)
Total Protein: 7.9 g/dL (ref 6.0–8.3)

## 2015-01-07 LAB — TSH: TSH: 0.91 u[IU]/mL (ref 0.350–4.500)

## 2015-01-07 MED ORDER — AMLODIPINE BESYLATE 5 MG PO TABS: 5.0000 mg | ORAL_TABLET | Freq: Every day | ORAL | Status: DC

## 2015-01-07 MED ORDER — ATORVASTATIN CALCIUM 40 MG PO TABS: 40.0000 mg | ORAL_TABLET | Freq: Every day | ORAL | Status: DC

## 2015-01-07 MED ORDER — AMLODIPINE BESYLATE 5 MG PO TABS: ORAL_TABLET | ORAL | Status: DC

## 2015-01-07 MED ORDER — METOPROLOL SUCCINATE ER 50 MG PO TB24: 50.0000 mg | ORAL_TABLET | Freq: Every day | ORAL | Status: DC

## 2015-01-07 NOTE — Progress Notes (Signed)
Subjective:    Patient ID: Raymond Moreno, male    DOB: [birth year], 59 y.o.   MRN: [id number]  HPI Chief Complaint  Patient presents with   Medication Refill    norvasc,toprol,zocor   This chart was scribed for Tami Lin, MD by Thea Alken, ED Scribe. This patient was seen in room 3 and the patient's care was started at 2:43 PM.  HPI Comments: Raymond Moreno is a 59 y.o. male who presents to the [facility] for medication refills  Pt had elevated BP and cholesterol last year during last blood check and states he was not consistently taking his medication during that time. He doesn't come in regularly and tries to stretch his medicine for a longer period of time. He is unsure why he is on metoprolol however Pt reports occasional tremors in hands noticed when he is writing and typing while at work--present for many years and not getting worse.  He denies  tremors with eating and drinking.  no family hx of tremors.   Pt denies CP, SOB, trouble sleeping, and indigestion.    Pt reports left eye blurriness and has trouble seeing long distance in left eye. Pt admits to not seeing optometrist in several years but reports his relative Marcene Duos is his optometrist. He denies trouble seeing out of right. No testis was present at last years visit  Pt reports last TDAP was in 2012. Pt has not had a colonoscopy and does not want a screening done. He is unknowledgeable of hx of colon cancer.  Still smoking and not ready to quit   Works as a Press photographer  Even though chart indicates the need for tetanus coverage he had this in 2012  Past Medical History  Diagnosis Date   Hypertension    Hyperlipidemia    Past Surgical History  Procedure Laterality Date   Bunionectomy      left foot   Knee arthroscopy      right knee   Prior to Admission medications   Medication Sig Start Date End Date Taking? Authorizing  Provider  amLODipine (NORVASC) 5 MG tablet NO MORE REFILLS 3RD 01/07/15  Yes Mancel Bale, PA-C  aspirin 81 MG tablet Take 81 mg by mouth daily.   Yes Historical Provider, MD  metoprolol succinate (TOPROL-XL) 50 MG 24 hr tablet Take 1 tablet (50 mg total) by mouth daily. NO MORE REFILLS WITHOUT OFFICE VISIT - 2ND NOTICE 12/31/14  Yes Dorian Heckle English, PA  simvastatin (ZOCOR) 20 MG tablet Take 1 tablet (20 mg total) by mouth at bedtime. PATIENT NEEDS OFFICE VISIT/FASTING LABS FOR ADDITIONAL REFILLS 11/27/14  Yes Wardell Honour, MD   Review of Systems  Constitutional: Negative for activity change, fatigue and unexpected weight change.  Eyes: Positive for visual disturbance.  Respiratory: Negative for cough, choking, chest tightness, shortness of breath and wheezing.   Cardiovascular: Negative for chest pain, palpitations and leg swelling.  Gastrointestinal: Negative for abdominal pain, diarrhea and constipation.  Genitourinary: Negative for frequency.  Neurological: Positive for tremors. Negative for light-headedness.  Psychiatric/Behavioral: Negative for behavioral problems and sleep disturbance.      Objective:   Physical Exam  Constitutional: He is oriented to person, place, and time. He appears well-developed and well-nourished. No distress.  HENT:  Head: Normocephalic and atraumatic.  Eyes: Conjunctivae and EOM are normal. Pupils are equal, round, and reactive to light.  Neck: Neck supple. No thyromegaly present.  Cardiovascular: Normal rate, regular rhythm, normal heart sounds and intact distal pulses.   No murmur heard. Pulmonary/Chest: Effort normal and breath sounds normal.  Musculoskeletal: Normal range of motion.  Lymphadenopathy:    He has no cervical adenopathy.  Neurological: He is alert and oriented to person, place, and time. No cranial nerve deficit.  Mild tremor finger to nose bilateral but not at rest.   Skin: Skin is warm and dry.  Psychiatric: He has a normal mood  and affect. His behavior is normal.  Nursing note and vitals reviewed.   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 Cataract/referred to optometry of his choice  Hyperlipemia - Plan: Lipid panel////changed to atorvastatin  Adult body mass index 37.0-37.9-----discussed the need for 100 pounds weight loss through diet and neck size  Screening for prostate cancer - Plan: PSA  Smoker--- he needs to quit this should be a big plan for the next year  Unfortunately he would not except referral for colonoscopy  Meds ordered this encounter  Medications   atorvastatin (LIPITOR) 40 MG tablet    Sig: Take 1 tablet (40 mg total) by mouth daily.    Dispense:  90 tablet    Refill:  3   amLODipine (NORVASC) 5 MG tablet    Sig: Take 1 tablet (5 mg total) by mouth daily.    Dispense:  90 tablet    Refill:  3   metoprolol succinate (TOPROL-XL) 50 MG 24 hr tablet    Sig: Take 1 tablet (50 mg total) by mouth daily.    Dispense:  90 tablet    Refill:  3   Notify lab results and follow-up plan  I have completed the patient encounter in its entirety as documented by the scribe, with editing by me where necessary. Robert P. Laney Pastor, M.D.

## 2015-01-08 ENCOUNTER — Encounter: Payer: Self-pay | Admitting: Internal Medicine

## 2015-01-08 LAB — PSA: PSA: 0.68 ng/mL (ref <=4.00)

## 2016-01-01 ENCOUNTER — Other Ambulatory Visit: Payer: Self-pay | Admitting: Internal Medicine

## 2016-02-15 ENCOUNTER — Other Ambulatory Visit: Payer: Self-pay | Admitting: Internal Medicine

## 2016-04-02 ENCOUNTER — Telehealth: Payer: Self-pay

## 2016-04-02 NOTE — Telephone Encounter (Addendum)
Pharm sent req for RF of amlodipine. Pt has not been seen since 01/2015. On last refill last month we put a note advising he needed an OV for more RFs. I tried to call pt to advise and discuss f/up plan, but no ans and no VM on home #. I let it ring a long time and never got a VM

## 2016-04-03 NOTE — Telephone Encounter (Signed)
Tried to call pt on home number again since it is highlighted #, but again got no VM. Called cell number and was able to leave detailed message re: need for f/up and how to call for same day appt. Asked for CB to me if he can not get in right away to discuss what day he can come in to see if I can get him a small refill to cover him until then.

## 2016-04-04 NOTE — Telephone Encounter (Signed)
The patient returned our call.  He said that he understood that he needed an office visit in order to get his Amlodipine refilled.  He said he has about a week left of his medication, so he would call back to schedule his same day appointment.

## 2016-04-22 ENCOUNTER — Ambulatory Visit (INDEPENDENT_AMBULATORY_CARE_PROVIDER_SITE_OTHER): Payer: BC Managed Care – PPO | Admitting: Family Medicine

## 2016-04-22 VITALS — BP 132/80 | HR 75 | Temp 98.1°F | Resp 17 | Ht 72.5 in | Wt 247.0 lb

## 2016-04-22 DIAGNOSIS — R351 Nocturia: Secondary | ICD-10-CM | POA: Diagnosis not present

## 2016-04-22 DIAGNOSIS — I10 Essential (primary) hypertension: Secondary | ICD-10-CM

## 2016-04-22 DIAGNOSIS — R197 Diarrhea, unspecified: Secondary | ICD-10-CM

## 2016-04-22 DIAGNOSIS — Z1211 Encounter for screening for malignant neoplasm of colon: Secondary | ICD-10-CM | POA: Diagnosis not present

## 2016-04-22 DIAGNOSIS — Z720 Tobacco use: Secondary | ICD-10-CM | POA: Diagnosis not present

## 2016-04-22 DIAGNOSIS — C44612 Basal cell carcinoma of skin of right upper limb, including shoulder: Secondary | ICD-10-CM

## 2016-04-22 DIAGNOSIS — Z113 Encounter for screening for infections with a predominantly sexual mode of transmission: Secondary | ICD-10-CM

## 2016-04-22 DIAGNOSIS — L989 Disorder of the skin and subcutaneous tissue, unspecified: Secondary | ICD-10-CM | POA: Diagnosis not present

## 2016-04-22 DIAGNOSIS — Z125 Encounter for screening for malignant neoplasm of prostate: Secondary | ICD-10-CM | POA: Diagnosis not present

## 2016-04-22 DIAGNOSIS — F17200 Nicotine dependence, unspecified, uncomplicated: Secondary | ICD-10-CM

## 2016-04-22 DIAGNOSIS — E785 Hyperlipidemia, unspecified: Secondary | ICD-10-CM | POA: Diagnosis not present

## 2016-04-22 LAB — COMPREHENSIVE METABOLIC PANEL WITH GFR
ALT: 41 U/L (ref 9–46)
AST: 44 U/L — ABNORMAL HIGH (ref 10–35)
Albumin: 4.1 g/dL (ref 3.6–5.1)
Alkaline Phosphatase: 119 U/L — ABNORMAL HIGH (ref 40–115)
BUN: 23 mg/dL (ref 7–25)
CO2: 22 mmol/L (ref 20–31)
Calcium: 9.2 mg/dL (ref 8.6–10.3)
Chloride: 100 mmol/L (ref 98–110)
Creat: 1.28 mg/dL (ref 0.70–1.33)
Glucose, Bld: 89 mg/dL (ref 65–99)
Potassium: 4.7 mmol/L (ref 3.5–5.3)
Sodium: 135 mmol/L (ref 135–146)
Total Bilirubin: 0.8 mg/dL (ref 0.2–1.2)
Total Protein: 7.3 g/dL (ref 6.1–8.1)

## 2016-04-22 LAB — CBC
HEMATOCRIT: 41.3 % (ref 38.5–50.0)
Hemoglobin: 14 g/dL (ref 13.2–17.1)
MCH: 34.5 pg — AB (ref 27.0–33.0)
MCHC: 33.9 g/dL (ref 32.0–36.0)
MCV: 101.7 fL — AB (ref 80.0–100.0)
MPV: 9.9 fL (ref 7.5–12.5)
PLATELETS: 255 10*3/uL (ref 140–400)
RBC: 4.06 MIL/uL — ABNORMAL LOW (ref 4.20–5.80)
RDW: 14.4 % (ref 11.0–15.0)
WBC: 9.4 10*3/uL (ref 3.8–10.8)

## 2016-04-22 LAB — PSA: PSA: 0.4 ng/mL (ref <=4.0)

## 2016-04-22 LAB — LIPID PANEL
CHOL/HDL RATIO: 2.1 ratio (ref <=5.0)
CHOLESTEROL: 162 mg/dL (ref 125–200)
HDL: 78 mg/dL (ref >=40)
LDL CALC: 57 mg/dL (ref <130)
TRIGLYCERIDES: 134 mg/dL (ref <150)
VLDL: 27 mg/dL (ref <30)

## 2016-04-22 LAB — POCT URINALYSIS DIP (MANUAL ENTRY)
BILIRUBIN UA: NEGATIVE
BILIRUBIN UA: NEGATIVE
GLUCOSE UA: NEGATIVE
Leukocytes, UA: NEGATIVE
Nitrite, UA: NEGATIVE
PH UA: 5
Protein Ur, POC: NEGATIVE
RBC UA: NEGATIVE
Urobilinogen, UA: 1

## 2016-04-22 LAB — HIV ANTIBODY (ROUTINE TESTING W REFLEX): HIV 1&2 Ab, 4th Generation: NONREACTIVE

## 2016-04-22 LAB — HEPATITIS C ANTIBODY: HCV Ab: NEGATIVE

## 2016-04-22 LAB — TSH: TSH: 1.74 mIU/L (ref 0.40–4.50)

## 2016-04-22 MED ORDER — METOPROLOL SUCCINATE ER 50 MG PO TB24: 50.0000 mg | ORAL_TABLET | Freq: Every day | ORAL | 3 refills | Status: DC

## 2016-04-22 MED ORDER — ATORVASTATIN CALCIUM 40 MG PO TABS: 40.0000 mg | ORAL_TABLET | Freq: Every day | ORAL | 3 refills | Status: DC

## 2016-04-22 MED ORDER — AMLODIPINE BESYLATE 5 MG PO TABS: 5.0000 mg | ORAL_TABLET | Freq: Every day | ORAL | 3 refills | Status: DC

## 2016-04-22 NOTE — Patient Instructions (Addendum)
[facility] is now offering annual lung cancer screening by low-dose CT scan.  This is covered for qualifying patients and your insurance will be checked before the procedure.  Call the lung cancer screening nurse navigators at [phone] to learn more about this and get scheduled.     IF you received an x-ray today, you will receive an invoice from [facility]. Please contact [facility] at [phone] with questions or concerns regarding your invoice.   IF you received labwork today, you will receive an invoice from [employer]. Please contact [facility] at [phone] with questions or concerns regarding your invoice.   Our billing staff will not be able to assist you with questions regarding bills from these companies.  You will be contacted with the lab results as soon as they are available. The fastest way to get your results is to activate your My Chart account. Instructions are located on the last page of this paperwork. If you have not heard from [place] regarding the results in 2 weeks, please contact this office.    Skin Biopsy WHY AM I HAVING THIS TEST? This test examines skin tissue under a microscope. Your health care provider may perform this test to identify the source of a skin rash or lesion if an allergy is suspected. In this test, a tissue sample (biopsy) is taken to look at your skin's anatomy and to see if certain proteins and antibodies are present. WHAT KIND OF SAMPLE IS TAKEN? A small sample of skin is required for this test. It is usually collected by numbing an area of skin and removing a small circle of skin. HOW DO I PREPARE FOR THE TEST? There is no preparation required for this test. HOW ARE THE TEST RESULTS REPORTED? Your results may be reported in different ways and are dependent upon the kind of test that is performed. It is your responsibility to obtain your test results. Ask the lab or department performing the test  when and how you will get your results. It is most common for your results to be reported as:  Normal skin anatomy (histology).  No evidence of IgG, IgA, or IgM antibody, complement C3, or fibrinogen. WHAT DO THE RESULTS MEAN? Abnormal findings may indicate certain autoimmune diseases. This test can produce many different results. It is important to talk with your health care provider to discuss your results, treatment options, and if necessary, the need for more tests. Talk with your health care provider if you have any questions about your results.   This information is not intended to replace advice given to you by your health care provider. Make sure you discuss any questions you have with your health care provider.   Document Released: 09/25/2004 Document Revised: 09/14/2014 Document Reviewed: 11/21/2014 Elsevier Interactive Patient Education 2016 [place] Choices to Help Relieve Diarrhea, Adult When you have diarrhea, the foods you eat and your eating habits are very important. Choosing the right foods and drinks can help relieve diarrhea. Also, because diarrhea can last up to 7 days, you need to replace lost fluids and electrolytes (such as sodium, potassium, and chloride) in order to help prevent dehydration.  WHAT GENERAL GUIDELINES DO I NEED TO FOLLOW?  Slowly drink 1 cup (8 oz) of fluid for each episode of diarrhea. If you are getting enough fluid, your urine will be clear or pale yellow.  Eat starchy foods. Some good choices include white rice, white toast, pasta, low-fiber cereal, baked potatoes (without  the skin), saltine crackers, and bagels.  Avoid large servings of any cooked vegetables.  Limit fruit to two servings per day. A serving is  cup or 1 small piece.  Choose foods with less than 2 g of fiber per serving.  Limit fats to less than 8 tsp (38 g) per day.  Avoid fried foods.  Eat foods that have probiotics in them. Probiotics can be found in certain  dairy products.  Avoid foods and beverages that may increase the speed at which food moves through the stomach and intestines (gastrointestinal tract). Things to avoid include:  High-fiber foods, such as dried fruit, raw fruits and vegetables, nuts, seeds, and whole grain foods.  Spicy foods and high-fat foods.  Foods and beverages sweetened with high-fructose corn syrup, honey, or sugar alcohols such as xylitol, sorbitol, and mannitol. WHAT FOODS ARE RECOMMENDED? Grains White rice. White, [place], or pita breads (fresh or toasted), including plain rolls, buns, or bagels. White pasta. Saltine, soda, or graham crackers. Pretzels. Low-fiber cereal. Cooked cereals made with water (such as cornmeal, farina, or cream cereals). Plain muffins. Matzo. Melba toast. Zwieback.  Vegetables Potatoes (without the skin). Strained tomato and vegetable juices. Most well-cooked and canned vegetables without seeds. Tender lettuce. Fruits Cooked or canned applesauce, apricots, cherries, fruit cocktail, grapefruit, peaches, pears, or plums. Fresh bananas, apples without skin, cherries, grapes, cantaloupe, grapefruit, peaches, oranges, or plums.  Meat and Other Protein Products Baked or boiled chicken. Eggs. Tofu. Fish. Seafood. Smooth peanut butter. Ground or well-cooked tender beef, ham, veal, lamb, pork, or poultry.  Dairy Plain yogurt, kefir, and unsweetened liquid yogurt. Lactose-free milk, buttermilk, or soy milk. Plain hard cheese. Beverages Sport drinks. Clear broths. Diluted fruit juices (except prune). Regular, caffeine-free sodas such as ginger ale. Water. Decaffeinated teas. Oral rehydration solutions. Sugar-free beverages not sweetened with sugar alcohols. Other Bouillon, broth, or soups made from recommended foods.  The items listed above may not be a complete list of recommended foods or beverages. Contact your dietitian for more options. WHAT FOODS ARE NOT RECOMMENDED? Grains Whole grain, whole  wheat, bran, or rye breads, rolls, pastas, crackers, and cereals. Wild or brown rice. Cereals that contain more than 2 g of fiber per serving. Corn tortillas or taco shells. Cooked or dry oatmeal. Granola. Popcorn. Vegetables Raw vegetables. Cabbage, broccoli, Brussels sprouts, artichokes, baked beans, beet greens, corn, kale, legumes, peas, sweet potatoes, and yams. Potato skins. Cooked spinach and cabbage. Fruits Dried fruit, including raisins and dates. Raw fruits. Stewed or dried prunes. Fresh apples with skin, apricots, mangoes, pears, raspberries, and strawberries.  Meat and Other Protein Products Chunky peanut butter. Nuts and seeds. Beans and lentils. Berniece Salines.  Dairy High-fat cheeses. Milk, chocolate milk, and beverages made with milk, such as milk shakes. Cream. Ice cream. Sweets and Desserts Sweet rolls, doughnuts, and sweet breads. Pancakes and waffles. Fats and Oils Butter. Cream sauces. Margarine. Salad oils. Plain salad dressings. Olives. Avocados.  Beverages Caffeinated beverages (such as coffee, tea, soda, or energy drinks). Alcoholic beverages. Fruit juices with pulp. Prune juice. Soft drinks sweetened with high-fructose corn syrup or sugar alcohols. Other Coconut. Hot sauce. Chili powder. Mayonnaise. Gravy. Cream-based or milk-based soups.  The items listed above may not be a complete list of foods and beverages to avoid. Contact your dietitian for more information. WHAT SHOULD I DO IF I BECOME DEHYDRATED? Diarrhea can sometimes lead to dehydration. Signs of dehydration include dark urine and dry mouth and skin. If you think you are dehydrated, you should  rehydrate with an oral rehydration solution. These solutions can be purchased at pharmacies, retail stores, or online.  Drink -1 cup (120-240 mL) of oral rehydration solution each time you have an episode of diarrhea. If drinking this amount makes your diarrhea worse, try drinking smaller amounts more often. For example, drink  1-3 tsp (5-15 mL) every 5-10 minutes.  A general rule for staying hydrated is to drink 1-2 L of fluid per day. Talk to your health care provider about the specific amount you should be drinking each day. Drink enough fluids to keep your urine clear or pale yellow.   This information is not intended to replace advice given to you by your health care provider. Make sure you discuss any questions you have with your health care provider.   Document Released: 11/14/2003 Document Revised: 09/14/2014 Document Reviewed: 07/17/2013 Elsevier Interactive Patient Education 2016 [employer]. Smoking Hazards Smoking cigarettes is extremely bad for your health. Tobacco smoke has over 200 known poisons in it. It contains the poisonous gases nitrogen oxide and carbon monoxide. There are over 60 chemicals in tobacco smoke that cause cancer. Some of the chemicals found in cigarette smoke include:   Cyanide.   Benzene.   Formaldehyde.   Methanol (wood alcohol).   Acetylene (fuel used in welding torches).   Ammonia.  Even smoking lightly shortens your life expectancy by several years. You can greatly reduce the risk of medical problems for you and your family by stopping now. Smoking is the most preventable cause of death and disease in our society. Within days of quitting smoking, your circulation improves, you decrease the risk of having a heart attack, and your lung capacity improves. There may be some increased phlegm in the first few days after quitting, and it may take months for your lungs to clear up completely. Quitting for 10 years reduces your risk of developing lung cancer to almost that of a nonsmoker.  WHAT ARE THE RISKS OF SMOKING? Cigarette smokers have an increased risk of many serious medical problems, including:  Lung cancer.   Lung disease (such as pneumonia, bronchitis, and emphysema).   Heart attack and chest pain due to the heart not getting enough oxygen (angina).    Heart disease and peripheral blood vessel disease.   Hypertension.   Stroke.   Oral cancer (cancer of the lip, mouth, or voice box).   Bladder cancer.   Pancreatic cancer.   Cervical cancer.   Pregnancy complications, including premature birth.   Stillbirths and smaller newborn babies, birth defects, and genetic damage to sperm.   Early menopause.   Lower estrogen level for women.   Infertility.   Facial wrinkles.   Blindness.   Increased risk of broken bones (fractures).   Senile dementia.   Stomach ulcers and internal bleeding.   Delayed wound healing and increased risk of complications during surgery. Because of secondhand smoke exposure, children of smokers have an increased risk of the following:   Sudden infant death syndrome (SIDS).   Respiratory infections.   Lung cancer.   Heart disease.   Ear infections.  WHY IS SMOKING ADDICTIVE? Nicotine is the chemical agent in tobacco that is capable of causing addiction or dependence. When you smoke and inhale, nicotine is absorbed rapidly into the bloodstream through your lungs. Both inhaled and noninhaled nicotine may be addictive.  WHAT ARE THE BENEFITS OF QUITTING?  There are many health benefits to quitting smoking. Some are:   The likelihood of developing cancer and heart  disease decreases. Health improvements are seen almost immediately.   Blood pressure, pulse rate, and breathing patterns start returning to normal soon after quitting.   People who quit may see an improvement in their overall quality of life.  HOW DO YOU QUIT SMOKING? Smoking is an addiction with both physical and psychological effects, and longtime habits can be hard to change. Your health care provider can recommend:  Programs and community resources, which may include group support, education, or therapy.  Replacement products, such as patches, gum, and nasal sprays. Use these products only as  directed. Do not replace cigarette smoking with electronic cigarettes (commonly called e-cigarettes). The safety of e-cigarettes is unknown, and some may contain harmful chemicals. FOR MORE INFORMATION  American Lung Association: www.lung.org  American Cancer Society: www.cancer.org   This information is not intended to replace advice given to you by your health care provider. Make sure you discuss any questions you have with your health care provider.   Document Released: 10/01/2004 Document Revised: 06/14/2013 Document Reviewed: 02/13/2013 Elsevier Interactive Patient Education [employer].

## 2016-04-30 NOTE — Progress Notes (Addendum)
Subjective:  By signing my name below, I, Moises Blood, attest that this documentation has been prepared under the direction and in the presence of Delman Cheadle, MD. Electronically Signed: Moises Blood, [place]. 04/30/2016 , 9:40 AM .  Patient was seen in Room 12 .   Patient ID: Raymond Moreno, male    DOB: [birth year], 60 y.o.   MRN: [id number] Chief Complaint  Patient presents with  . Medication Refill    toprol, norvasc, lipitor   Medication Refill  Associated symptoms include abdominal pain. Pertinent negatives include no chest pain, chills, coughing, fatigue, fever, nausea, rash or vomiting.   Raymond Moreno is a 60 y.o. male who has h/o HTN and HLD presents to [facility] requesting medication refill of toprol, norvasc and lipitor. He occasionally checks his BP at [facility], which runs in around 130/70~80. He denies any complications with his medications. He denies history of heart issue.   He smokes about 0.5 ppd. He knows he should quit smoking, but he hasn't had the motivation to quit yet. He currently lives alone. He smokes mostly outside and smokes seldomly during work.   Patient is also concerned with possible IBS. He's noticed going to the bathroom 3~4 times a day while at work for the past 6 months. He doesn't have this frequency increase at home. He would occasionally have abdominal pain prior to the bowel movements. He denies constipation, nausea or vomiting. He's also noticed increasing nocturia.   Past Medical History:  Diagnosis Date  . Hyperlipidemia   . Hypertension    Prior to Admission medications   Medication Sig Start Date End Date Taking? Authorizing Provider  amLODipine (NORVASC) 5 MG tablet TAKE 1 TABLET BY MOUTH EVERY DAY 02/17/16  Yes Leandrew Koyanagi, MD  aspirin 81 MG tablet Take 81 mg by mouth daily.   Yes Historical Provider, MD  atorvastatin (LIPITOR) 40 MG tablet TAKE 1 TABLET BY MOUTH EVERY DAY 01/01/16  Yes Leandrew Koyanagi, MD  metoprolol succinate  (TOPROL-XL) 50 MG 24 hr tablet TAKE 1 TABLET BY MOUTH EVERY DAY 01/01/16  Yes Leandrew Koyanagi, MD   No Known Allergies  Past Surgical History:  Procedure Laterality Date  . BUNIONECTOMY     left foot  . KNEE ARTHROSCOPY     right knee   Family History  Problem Relation Age of Onset  . Heart disease Mother   . Hypertension Sister    Social History   Social History  . Marital status: Married    Spouse name: N/A  . Number of children: N/A  . Years of education: N/A   Social History Main Topics  . Smoking status: Current Every Day Smoker    Packs/day: 0.50    Years: 43.00    Types: Cigarettes  . Smokeless tobacco: None  . Alcohol use 0.0 oz/week  . Drug use: No  . Sexual activity: No   Other Topics Concern  . None   Social History Narrative  . None    Review of Systems  Constitutional: Negative for chills, fatigue and fever.  Respiratory: Negative for cough and shortness of breath.   Cardiovascular: Negative for chest pain.  Gastrointestinal: Positive for abdominal pain and diarrhea. Negative for constipation, nausea and vomiting.  Genitourinary: Positive for frequency. Negative for urgency.  Skin: Negative for rash and wound.       Objective:   Physical Exam  Constitutional: He is oriented to person, place, and time. He appears well-developed and well-nourished. No  distress.  HENT:  Head: Normocephalic and atraumatic.  Eyes: EOM are normal. Pupils are equal, round, and reactive to light.  Neck: Neck supple.  Cardiovascular: Normal rate, regular rhythm, S1 normal, S2 normal and normal heart sounds.   No murmur heard. Pulses:      Posterior tibial pulses are 2+ on the right side, and 2+ on the left side.  Pulmonary/Chest: Effort normal. No respiratory distress. He has decreased breath sounds.  Decreased air movement throughout  Abdominal: Soft. Bowel sounds are normal. He exhibits no distension. There is no tenderness. There is no rebound and no guarding.    Musculoskeletal: Normal range of motion. He exhibits no edema (lower extremity).  Neurological: He is alert and oriented to person, place, and time.  Skin: Skin is warm and dry.  Right mid forearm, radial aspect: 1cm raised eschar on a pearly pink base with scaling surrounding  Psychiatric: He has a normal mood and affect. His behavior is normal.  Nursing note and vitals reviewed.   BP 132/80 (BP Location: Right Arm, Patient Position: Sitting, Cuff Size: Normal)   Pulse 75   Temp 98.1 F (36.7 C) (Oral)   Resp 17   Ht 6' 0.5" (1.842 m)   Wt 247 lb (112 kg)   SpO2 96%   BMI 33.04 kg/m    Results for orders placed or performed in visit on 04/22/16  Comprehensive metabolic panel  Result Value Ref Range   Sodium 135 135 - 146 mmol/L   Potassium 4.7 3.5 - 5.3 mmol/L   Chloride 100 98 - 110 mmol/L   CO2 22 20 - 31 mmol/L   Glucose, Bld 89 65 - 99 mg/dL   BUN 23 7 - 25 mg/dL   Creat 1.28 0.70 - 1.33 mg/dL   Total Bilirubin 0.8 0.2 - 1.2 mg/dL   Alkaline Phosphatase 119 (H) 40 - 115 U/L   AST 44 (H) 10 - 35 U/L   ALT 41 9 - 46 U/L   Total Protein 7.3 6.1 - 8.1 g/dL   Albumin 4.1 3.6 - 5.1 g/dL   Calcium 9.2 8.6 - 10.3 mg/dL  PSA  Result Value Ref Range   PSA 0.4 <=4.0 ng/mL  TSH  Result Value Ref Range   TSH 1.74 0.40 - 4.50 mIU/L  Lipid panel  Result Value Ref Range   Cholesterol 162 125 - 200 mg/dL   Triglycerides 134 <150 mg/dL   HDL 78 >=40 mg/dL   Total CHOL/HDL Ratio 2.1 <=5.0 Ratio   VLDL 27 <30 mg/dL   LDL Cholesterol 57 <130 mg/dL  Hepatitis C Antibody  Result Value Ref Range   HCV Ab NEGATIVE NEGATIVE  CBC  Result Value Ref Range   WBC 9.4 3.8 - 10.8 K/uL   RBC 4.06 (L) 4.20 - 5.80 MIL/uL   Hemoglobin 14.0 13.2 - 17.1 g/dL   HCT 41.3 38.5 - 50.0 %   MCV 101.7 (H) 80.0 - 100.0 fL   MCH 34.5 (H) 27.0 - 33.0 pg   MCHC 33.9 32.0 - 36.0 g/dL   RDW 14.4 11.0 - 15.0 %   Platelets 255 140 - 400 K/uL   MPV 9.9 7.5 - 12.5 fL  HIV antibody  Result Value Ref  Range   HIV 1&2 Ab, 4th Generation NONREACTIVE NONREACTIVE  POCT urinalysis dipstick  Result Value Ref Range   Color, UA yellow yellow   Clarity, UA clear clear   Glucose, UA negative negative   Bilirubin, UA negative negative  Ketones, POC UA negative negative   Spec Grav, UA <=1.005    Blood, UA negative negative   pH, UA 5.0    Protein Ur, POC negative negative   Urobilinogen, UA 1.0    Nitrite, UA Negative Negative   Leukocytes, UA Negative Negative   Risks and benefits reviewed. Verbal informed consent obtained. Cleansed with EtoH x 2 followed by betadine x 2. Anesthesia with 3 cc of 2% lidocaine with epi. Removed the 0.5 cm eschar with surrounding erythematous rolled border with scaling by shave biopsy without complication. Hemostasis achieved with drysol. Pt tolerated procedure well. EBL <1cc. Pressure dressing applied    Assessment & Plan:   1. Essential hypertension - refilled amlodipine 5 and toprol 50  2. Hyperlipemia - refilled lipitor 40  3. Smoker - encouraged cessation - pt not interested at this time. Recommend screening lung CT - info given  4. Special screening for malignant neoplasms, colon   5. Screening for prostate cancer   6. Diarrhea, unspecified type   7. Nocturia   8. Skin lesion of right arm - concern for carcinoma so removed by shave biopsy - path shows basal cell carcinoma with atypical cells at base of tissue to refer to derm for repeat excision.  9. Screening for STD (sexually transmitted disease)    Recheck in 69mos for CPE - long overdue.  Orders Placed This Encounter  Procedures  . Comprehensive metabolic panel    Order Specific Question:   Has the patient fasted?    Answer:   Yes  . PSA  . TSH  . Lipid panel    Order Specific Question:   Has the patient fasted?    Answer:   Yes  . Hepatitis C Antibody  . CBC  . HIV antibody  . Ambulatory referral to Gastroenterology    Referral Priority:   Routine    Referral Type:   Consultation     Referral Reason:   Specialty Services Required    Number of Visits Requested:   1  . Ambulatory referral to Dermatology    Referral Priority:   Routine    Referral Type:   Consultation    Referral Reason:   Specialty Services Required    Requested Specialty:   Dermatology    Number of Visits Requested:   1  . POCT urinalysis dipstick    Meds ordered this encounter  Medications  . metoprolol succinate (TOPROL-XL) 50 MG 24 hr tablet    Sig: Take 1 tablet (50 mg total) by mouth daily. Take with or immediately following a meal.    Dispense:  90 tablet    Refill:  3  . atorvastatin (LIPITOR) 40 MG tablet    Sig: Take 1 tablet (40 mg total) by mouth daily.    Dispense:  90 tablet    Refill:  3  . amLODipine (NORVASC) 5 MG tablet    Sig: Take 1 tablet (5 mg total) by mouth daily.    Dispense:  90 tablet    Refill:  3    I personally performed the services described in this documentation, which was scribed in my presence. The recorded information has been reviewed and considered, and addended by me as needed.   Delman Cheadle, M.D.  [facility] [address] [place], [place] [address] [phone] phone [phone] fax  04/30/16 9:03 AM  ADDENDUM: PATH REPORT: Skin , upper right forearm, radial aspect BASAL CELL CARCINOMA, SUPERFICIAL, NODULAR,  AND INFILTRATIVE PATTERNS, ULCERATED Microscopic Description There are superficial nests of atypical basaloid cells attached to the basal epidermis with nests of atypical basaloid cells in the dermis. Infiltrating cords of basaloid cells are also present. There is ulceration of the epidermis in these sections and an inflammatory infiltrate in the ulcer base. Refer to derm for repeat excision since margins and base not clear.

## 2016-10-23 ENCOUNTER — Ambulatory Visit (INDEPENDENT_AMBULATORY_CARE_PROVIDER_SITE_OTHER): Payer: BC Managed Care – PPO

## 2016-10-23 ENCOUNTER — Ambulatory Visit (INDEPENDENT_AMBULATORY_CARE_PROVIDER_SITE_OTHER): Payer: BC Managed Care – PPO | Admitting: Family Medicine

## 2016-10-23 VITALS — BP 106/62 | HR 104 | Temp 98.0°F | Resp 16 | Ht 72.5 in | Wt 231.0 lb

## 2016-10-23 DIAGNOSIS — M25562 Pain in left knee: Secondary | ICD-10-CM

## 2016-10-23 DIAGNOSIS — Z23 Encounter for immunization: Secondary | ICD-10-CM

## 2016-10-23 LAB — POCT CBC
GRANULOCYTE PERCENT: 80.2 % — AB (ref 37–80)
HCT, POC: 43.7 % (ref 43.5–53.7)
Hemoglobin: 15.2 g/dL (ref 14.1–18.1)
Lymph, poc: 1.4 (ref 0.6–3.4)
MCH, POC: 34.7 pg — AB (ref 27–31.2)
MCHC: 34.7 g/dL (ref 31.8–35.4)
MCV: 100 fL — AB (ref 80–97)
MID (CBC): 1.3 — AB (ref 0–0.9)
MPV: 8.2 fL (ref 0–99.8)
PLATELET COUNT, POC: 351 10*3/uL (ref 142–424)
POC Granulocyte: 10.9 — AB (ref 2–6.9)
POC LYMPH %: 10.4 % (ref 10–50)
POC MID %: 9.4 %M (ref 0–12)
RBC: 4.37 M/uL — AB (ref 4.69–6.13)
RDW, POC: 15 %
WBC: 13.6 10*3/uL — AB (ref 4.6–10.2)

## 2016-10-23 NOTE — Progress Notes (Signed)
Patient ID: Raymond Moreno, male    DOB: 1956-01-01, 61 y.o.   MRN: [id number]  PCP: No primary care provider on file.  Chief Complaint  Patient presents with  . Leg Pain    Began Wednesday   . Leg Swelling    Left knee    Subjective:  HPI 61 year old male presents for evaluation of left knee swelling and pain x 3 days. Pt reports past medical hx of osteoarthritis of bilateral knees. Reports awakening 3 days ago with pain in left knee and mild swelling. Swelling has gradually increased.  He has been unable to bare weight x 2 days. No prior hx of gout. He hasn't taken any antiinflammatory medication or applied any ice to knee. No recent changes in diet or increase intake of alcohol, seafood, or organ meat.  Social History   Social History  . Marital status: Married    Spouse name: N/A  . Number of children: N/A  . Years of education: N/A   Occupational History  . Not on file.   Social History Main Topics  . Smoking status: Current Every Day Smoker    Packs/day: 0.50    Years: 43.00    Types: Cigarettes  . Smokeless tobacco: Not on file  . Alcohol use 0.0 oz/week  . Drug use: No  . Sexual activity: No   Other Topics Concern  . Not on file   Social History Narrative  . No narrative on file    Family History  Problem Relation Age of Onset  . Heart disease Mother   . Hypertension Sister    Review of Systems See HPI  Patient Active Problem List   Diagnosis Date Noted  . Tremor of both hands 01/07/2015  . Vision loss of left eye 01/07/2015  . Smoker 01/07/2015  . Adult body mass index 37.0-37.9 01/04/2012  . HTN (hypertension) 01/04/2012  . Hyperlipemia 01/04/2012    No Known Allergies  Prior to Admission medications   Medication Sig Start Date End Date Taking? Authorizing Provider  amLODipine (NORVASC) 5 MG tablet Take 1 tablet (5 mg total) by mouth daily. 04/22/16  Yes Shawnee Knapp, MD  aspirin 81 MG tablet Take 81 mg by mouth daily.   Yes  Historical Provider, MD  atorvastatin (LIPITOR) 40 MG tablet Take 1 tablet (40 mg total) by mouth daily. 04/22/16  Yes Shawnee Knapp, MD  metoprolol succinate (TOPROL-XL) 50 MG 24 hr tablet Take 1 tablet (50 mg total) by mouth daily. Take with or immediately following a meal. 04/22/16  Yes Shawnee Knapp, MD    Past Medical, Surgical Family and Social History reviewed and updated.    Objective:   Today's Vitals   10/23/16 1332  BP: 106/62  Pulse: (!) 104  Resp: 16  Temp: 98 F (36.7 C)  TempSrc: Oral  SpO2: 97%  Weight: 231 lb (104.8 kg)  Height: 6' 0.5" (1.842 m)    Wt Readings from Last 3 Encounters:  10/23/16 231 lb (104.8 kg)  04/22/16 247 lb (112 kg)  01/07/15 258 lb (117 kg)   Physical Exam  Constitutional: He is oriented to person, place, and time. He appears well-developed and well-nourished.  HENT:  Head: Normocephalic and atraumatic.  Eyes: Conjunctivae and EOM are normal. Pupils are equal, round, and reactive to light.  Cardiovascular: Normal rate, regular rhythm, normal heart sounds and intact distal pulses.   Pulmonary/Chest: Effort normal and breath sounds normal.  Musculoskeletal: He exhibits edema  and tenderness.       Left knee: He exhibits decreased range of motion, swelling, effusion, erythema and bony tenderness. Tenderness found.  Neurological: He is alert and oriented to person, place, and time.  Skin: Skin is warm and dry.  Psychiatric: He has a normal mood and affect. His behavior is normal. Thought content normal.      Assessment & Plan:  1. Left knee pain, unspecified chronicity -Acute Gout vs Bursitis of knee  Digital imaging revealed large joint effusion and osteoarthritis   Plan: Take Prednisone 20 mg,  in mornings with breakfast as follows:   Take 3 pills for 3 days, Take 2 pills for 3 days, and Take 1 pill for 3 days.  Complete all medication.  Apply ice for 20 minute increments for pain relief.     I will notify you of your uric acid level  and CBC upon receipt.  2. Need for prophylactic vaccination and inoculation against influenza   Carroll Sage. Kenton Kingfisher, MSN, FNP-C [facility]

## 2016-10-23 NOTE — Patient Instructions (Addendum)
Take Prednisone 20 mg,  in mornings with breakfast as follows:   Take 3 pills for 3 days, Take 2 pills for 3 days, and Take 1 pill for 3 days.  Complete all medication.  Apply ice for 20 minute increments for pain relief.     I will notify you of your uric acid level and CBC upon receipt.   IF you received an x-ray today, you will receive an invoice from [facility]. Please contact [facility] at [phone] with questions or concerns regarding your invoice.   IF you received labwork today, you will receive an invoice from [place]. Please contact [facility] at [phone] with questions or concerns regarding your invoice.   Our billing staff will not be able to assist you with questions regarding bills from these companies.  You will be contacted with the lab results as soon as they are available. The fastest way to get your results is to activate your My Chart account. Instructions are located on the last page of this paperwork. If you have not heard from [place] regarding the results in 2 weeks, please contact this office.     Knee Effusion Introduction Knee effusion means that you have extra fluid in your knee. This can cause pain. Your knee may be more difficult to bend and move. Follow these instructions at home:  Use crutches as told by your doctor.  Wear a knee brace as told by your doctor.  Apply ice to the swollen area:  Put ice in a plastic bag.  Place a towel between your skin and the bag.  Leave the ice on for 20 minutes, 2-3 times per day.  Keep your knee raised (elevated) when you are sitting or lying down.  Take medicines only as told by your doctor.  Do any rehabilitation or strengthening exercises as told by your doctor.  Rest your knee as told by your doctor. You may start doing your normal activities again when your doctor says it is okay.  Keep all follow-up visits as told by your doctor. This is important. Contact a doctor if:  You  continue to have pain in your knee. Get help right away if:  You have increased swelling or redness of your knee.  You have severe pain in your knee.  You have a fever. This information is not intended to replace advice given to you by your health care provider. Make sure you discuss any questions you have with your health care provider. Document Released: 09/26/2010 Document Revised: 01/30/2016 Document Reviewed: 04/09/2014  2017 Elsevier

## 2016-10-24 ENCOUNTER — Telehealth: Payer: Self-pay

## 2016-10-24 DIAGNOSIS — D72829 Elevated white blood cell count, unspecified: Secondary | ICD-10-CM

## 2016-10-24 LAB — SEDIMENTATION RATE: SED RATE: 60 mm/h — AB (ref 0–30)

## 2016-10-24 LAB — URIC ACID: Uric Acid: 9.7 mg/dL — ABNORMAL HIGH (ref 3.7–8.6)

## 2016-10-24 MED ORDER — PREDNISONE 20 MG PO TABS: ORAL_TABLET | ORAL | 0 refills | Status: DC

## 2016-10-24 MED ORDER — COLCHICINE 0.6 MG PO TABS: 0.6000 mg | ORAL_TABLET | Freq: Every day | ORAL | 2 refills | Status: DC

## 2016-10-24 NOTE — Telephone Encounter (Signed)
Spoke with patient in reference to prior note but also requested that return to office one day early in the week for a repeat CBC due to elevated WBC. If this remains inspite of treatment, advised we will need to draw fluid from his knee to rule out any type of infectious process occurring. Order in system for repeat CBC. He is picking up meds today.

## 2016-10-24 NOTE — Telephone Encounter (Signed)
Prednisone described in ov was not sent to pharmacy per pt, please send

## 2016-10-24 NOTE — Telephone Encounter (Signed)
Raymond Moreno is calling patient to discuss

## 2016-10-24 NOTE — Telephone Encounter (Signed)
Tell patient that was my error and I apologize.  I have sent over 9 days of Prednisone.  Once completed I would like for him to start Colchine 0.6 mg for gout prevention. I received his lab results back and he has elevated uric acid which reflects likely gout. He should keep his follow-up appointment with [place].  Carroll Sage. Kenton Kingfisher, MSN, FNP-C [facility] [place]

## 2016-10-31 ENCOUNTER — Ambulatory Visit (INDEPENDENT_AMBULATORY_CARE_PROVIDER_SITE_OTHER): Payer: BC Managed Care – PPO | Admitting: Family Medicine

## 2016-10-31 VITALS — BP 116/68 | HR 91 | Temp 98.0°F | Resp 16 | Ht 72.0 in | Wt 234.0 lb

## 2016-10-31 DIAGNOSIS — M1310 Monoarthritis, not elsewhere classified, unspecified site: Secondary | ICD-10-CM | POA: Diagnosis not present

## 2016-10-31 DIAGNOSIS — D72819 Decreased white blood cell count, unspecified: Secondary | ICD-10-CM | POA: Diagnosis not present

## 2016-10-31 DIAGNOSIS — D72829 Elevated white blood cell count, unspecified: Secondary | ICD-10-CM

## 2016-10-31 NOTE — Progress Notes (Signed)
   Raymond Moreno is a 61 y.o. male who presents to Primary Care at [facility] today for FU for knee swelling:  1.  Knee swelling:  Patient seen here February 16 for sudden onset of left knee pain. Also with swelling. Knee pain and swelling started 2 days prior to him being seen.  Question of bursitis versus gout. Patient was treated with prednisone. CBC was also drawn. This came back with an elevated white count. Because of the elevated white count there was concern for possible septic arthritis and patient was recommended to return to clinic.  He comes back today to have his white count rechecked. His knee is completely improved. His had no swelling or redness or pain for the past several days. He states it is "completely back to normal." No other joint pains. No fevers or chills. He is eating and drinking well. No nausea or vomiting. No injuries to his knees or legs. He has no history of gout in the past.  ROS as above.  Pertinently, no chest pain, palpitations, SOB, Fever, Chills, Abd pain, N/V/D.   PMH reviewed. Patient is a nonsmoker.   Past Medical History:  Diagnosis Date  . Hyperlipidemia   . Hypertension    Past Surgical History:  Procedure Laterality Date  . BUNIONECTOMY     left foot  . KNEE ARTHROSCOPY     right knee    Medications reviewed. Current Outpatient Prescriptions  Medication Sig Dispense Refill  . amLODipine (NORVASC) 5 MG tablet Take 1 tablet (5 mg total) by mouth daily. 90 tablet 3  . aspirin 81 MG tablet Take 81 mg by mouth daily.    Marland Kitchen atorvastatin (LIPITOR) 40 MG tablet Take 1 tablet (40 mg total) by mouth daily. 90 tablet 3  . colchicine 0.6 MG tablet Take 1 tablet (0.6 mg total) by mouth daily. 90 tablet 2  . metoprolol succinate (TOPROL-XL) 50 MG 24 hr tablet Take 1 tablet (50 mg total) by mouth daily. Take with or immediately following a meal. 90 tablet 3  . predniSONE (DELTASONE) 20 MG tablet Take 3 PO QAM x3days, 2 PO QAM x3days, 1 PO QAM x3days 18  tablet 0   No current facility-administered medications for this visit.      Physical Exam:  BP 116/68   Pulse 91   Temp 98 F (36.7 C)   Resp 16   Ht 6' (1.829 m)   Wt 234 lb (106.1 kg)   SpO2 98%   BMI 31.74 kg/m  Gen:  Alert, cooperative patient who appears stated age in no acute distress.  Vital signs reviewed.  Well appearing, awake and alert HEENT: EOMI,  MMM MSK:  Both knees WNL.  No knee pain/tenderness/redness/effusion/swelling Ext:  No LE edema BL  Assessment and Plan:  1.  Monoarthritis: - treated for gout vs bursitis - completely resolved today - has only 2 days left of prednisone - uric acid elevated  - FU if gout returns  2. Leukocytosis: - likely demargination from pain - rechecking today.  Will let him know results.  - if persists, may need further workup.

## 2016-10-31 NOTE — Patient Instructions (Addendum)
  It was good to meet you today!  I'm glad to hear that your knee is doing so much better.  We are checking you white count today and will let you know the results.  Let us know if you're having any return of your symptoms   IF you received an x-ray today, you will receive an invoice from [facility]. Please contact [facility] at [phone] with questions or concerns regarding your invoice.   IF you received labwork today, you will receive an invoice from [place] [facility]. Please contact [facility] at [phone] with questions or concerns regarding your invoice.   Our billing staff will not be able to assist you with questions regarding bills from these companies.  You will be contacted with the lab results as soon as they are available. The fastest way to get your results is to activate your My Chart account. Instructions are located on the last page of this paperwork. If you have not heard from [place] regarding the results in 2 weeks, please contact this office.

## 2016-11-01 LAB — CBC WITH DIFFERENTIAL/PLATELET
BASOS: 0 %
Basophils Absolute: 0 10*3/uL (ref 0.0–0.2)
EOS (ABSOLUTE): 0.1 10*3/uL (ref 0.0–0.4)
Eos: 0 %
HEMATOCRIT: 40.3 % (ref 37.5–51.0)
Hemoglobin: 13.8 g/dL (ref 13.0–17.7)
Immature Grans (Abs): 0.2 10*3/uL — ABNORMAL HIGH (ref 0.0–0.1)
Immature Granulocytes: 1 %
LYMPHS ABS: 2.3 10*3/uL (ref 0.7–3.1)
Lymphs: 16 %
MCH: 34.3 pg — AB (ref 26.6–33.0)
MCHC: 34.2 g/dL (ref 31.5–35.7)
MCV: 100 fL — AB (ref 79–97)
MONOS ABS: 0.8 10*3/uL (ref 0.1–0.9)
Monocytes: 6 %
NEUTROS ABS: 11.5 10*3/uL — AB (ref 1.4–7.0)
NEUTROS PCT: 77 %
PLATELETS: 386 10*3/uL — AB (ref 150–379)
RBC: 4.02 x10E6/uL — ABNORMAL LOW (ref 4.14–5.80)
RDW: 14.4 % (ref 12.3–15.4)
WBC: 14.9 10*3/uL — ABNORMAL HIGH (ref 3.4–10.8)

## 2016-11-06 ENCOUNTER — Other Ambulatory Visit: Payer: Self-pay | Admitting: Family Medicine

## 2016-11-06 DIAGNOSIS — D72829 Elevated white blood cell count, unspecified: Secondary | ICD-10-CM

## 2016-11-06 NOTE — Progress Notes (Signed)
CBC for persistent leukocytosis x 2.  If persists next check, will refer to hematology.

## 2017-04-24 ENCOUNTER — Other Ambulatory Visit: Payer: Self-pay | Admitting: Family Medicine

## 2017-04-28 NOTE — Telephone Encounter (Signed)
Please call and schedule patient and appt within 1 month with Dr Brigitte Pulse.

## 2017-06-22 ENCOUNTER — Inpatient Hospital Stay (HOSPITAL_COMMUNITY)
Admission: EM | Admit: 2017-06-22 | Discharge: 2017-06-26 | DRG: 194 | Disposition: A | Discharge status: Skilled Nursing Facility | Payer: BC Managed Care – PPO | Attending: Student in an Organized Health Care Education/Training Program | Admitting: Student in an Organized Health Care Education/Training Program

## 2017-06-22 ENCOUNTER — Emergency Department (HOSPITAL_COMMUNITY): Payer: BC Managed Care – PPO

## 2017-06-22 ENCOUNTER — Encounter (HOSPITAL_COMMUNITY): Payer: Self-pay

## 2017-06-22 DIAGNOSIS — R740 Nonspecific elevation of levels of transaminase and lactic acid dehydrogenase [LDH]: Secondary | ICD-10-CM

## 2017-06-22 DIAGNOSIS — Z720 Tobacco use: Secondary | ICD-10-CM

## 2017-06-22 DIAGNOSIS — J189 Pneumonia, unspecified organism: Secondary | ICD-10-CM

## 2017-06-22 DIAGNOSIS — Z8249 Family history of ischemic heart disease and other diseases of the circulatory system: Secondary | ICD-10-CM | POA: Diagnosis not present

## 2017-06-22 DIAGNOSIS — E440 Moderate protein-calorie malnutrition: Secondary | ICD-10-CM | POA: Diagnosis present

## 2017-06-22 DIAGNOSIS — Z6828 Body mass index (BMI) 28.0-28.9, adult: Secondary | ICD-10-CM

## 2017-06-22 DIAGNOSIS — R634 Abnormal weight loss: Secondary | ICD-10-CM

## 2017-06-22 DIAGNOSIS — Z23 Encounter for immunization: Secondary | ICD-10-CM | POA: Diagnosis not present

## 2017-06-22 DIAGNOSIS — D539 Nutritional anemia, unspecified: Secondary | ICD-10-CM | POA: Diagnosis present

## 2017-06-22 DIAGNOSIS — F17210 Nicotine dependence, cigarettes, uncomplicated: Secondary | ICD-10-CM | POA: Diagnosis present

## 2017-06-22 DIAGNOSIS — J181 Lobar pneumonia, unspecified organism: Principal | ICD-10-CM | POA: Diagnosis present

## 2017-06-22 DIAGNOSIS — R5381 Other malaise: Secondary | ICD-10-CM | POA: Diagnosis present

## 2017-06-22 DIAGNOSIS — R61 Generalized hyperhidrosis: Secondary | ICD-10-CM

## 2017-06-22 DIAGNOSIS — R112 Nausea with vomiting, unspecified: Secondary | ICD-10-CM

## 2017-06-22 DIAGNOSIS — N179 Acute kidney failure, unspecified: Secondary | ICD-10-CM | POA: Diagnosis present

## 2017-06-22 DIAGNOSIS — Z808 Family history of malignant neoplasm of other organs or systems: Secondary | ICD-10-CM

## 2017-06-22 DIAGNOSIS — F1010 Alcohol abuse, uncomplicated: Secondary | ICD-10-CM | POA: Diagnosis present

## 2017-06-22 DIAGNOSIS — R630 Anorexia: Secondary | ICD-10-CM

## 2017-06-22 DIAGNOSIS — R7989 Other specified abnormal findings of blood chemistry: Secondary | ICD-10-CM

## 2017-06-22 HISTORY — DX: Pneumonia, unspecified organism: J18.9

## 2017-06-22 HISTORY — DX: Unspecified osteoarthritis, unspecified site: M19.90

## 2017-06-22 LAB — CBC WITH DIFFERENTIAL/PLATELET
BASOS PCT: 0 %
Basophils Absolute: 0 10*3/uL (ref 0.0–0.1)
EOS PCT: 0 %
Eosinophils Absolute: 0 10*3/uL (ref 0.0–0.7)
HEMATOCRIT: 33.9 % — AB (ref 39.0–52.0)
Hemoglobin: 11.6 g/dL — ABNORMAL LOW (ref 13.0–17.0)
Lymphocytes Relative: 6 %
Lymphs Abs: 1.5 10*3/uL (ref 0.7–4.0)
MCH: 37.2 pg — AB (ref 26.0–34.0)
MCHC: 34.2 g/dL (ref 30.0–36.0)
MCV: 108.7 fL — AB (ref 78.0–100.0)
MONO ABS: 1.8 10*3/uL — AB (ref 0.1–1.0)
MONOS PCT: 7 %
NEUTROS PCT: 87 %
Neutro Abs: 21.8 10*3/uL — ABNORMAL HIGH (ref 1.7–7.7)
PLATELETS: 530 10*3/uL — AB (ref 150–400)
RBC: 3.12 MIL/uL — AB (ref 4.22–5.81)
RDW: 19.9 % — ABNORMAL HIGH (ref 11.5–15.5)
WBC: 25.1 10*3/uL — ABNORMAL HIGH (ref 4.0–10.5)

## 2017-06-22 LAB — COMPREHENSIVE METABOLIC PANEL
ALBUMIN: 2.7 g/dL — AB (ref 3.5–5.0)
ALT: 11 U/L — ABNORMAL LOW (ref 17–63)
AST: 18 U/L (ref 15–41)
Alkaline Phosphatase: 118 U/L (ref 38–126)
Anion gap: 16 — ABNORMAL HIGH (ref 5–15)
BUN: 36 mg/dL — AB (ref 6–20)
CHLORIDE: 92 mmol/L — AB (ref 101–111)
CO2: 24 mmol/L (ref 22–32)
CREATININE: 1.76 mg/dL — AB (ref 0.61–1.24)
Calcium: 9.2 mg/dL (ref 8.9–10.3)
GFR calc non Af Amer: 40 mL/min — ABNORMAL LOW (ref >60)
GFR, EST AFRICAN AMERICAN: 46 mL/min — AB (ref >60)
Glucose, Bld: 129 mg/dL — ABNORMAL HIGH (ref 65–99)
POTASSIUM: 3.9 mmol/L (ref 3.5–5.1)
SODIUM: 132 mmol/L — AB (ref 135–145)
Total Bilirubin: 0.7 mg/dL (ref 0.3–1.2)
Total Protein: 7.2 g/dL (ref 6.5–8.1)

## 2017-06-22 LAB — I-STAT CG4 LACTIC ACID, ED: LACTIC ACID, VENOUS: 2.46 mmol/L — AB (ref 0.5–1.9)

## 2017-06-22 LAB — I-STAT TROPONIN, ED: Troponin i, poc: 0.02 ng/mL (ref 0.00–0.08)

## 2017-06-22 LAB — LIPASE, BLOOD: LIPASE: 32 U/L (ref 11–51)

## 2017-06-22 LAB — ETHANOL

## 2017-06-22 LAB — PROCALCITONIN: Procalcitonin: 0.4 ng/mL

## 2017-06-22 MED ORDER — VITAMIN B-1 100 MG PO TABS
100.0000 mg | ORAL_TABLET | Freq: Every day | ORAL | Status: DC
  Administered 2017-06-23 – 2017-06-26 (×4): 100 mg via ORAL
  Filled 2017-06-22 (×5): qty 1

## 2017-06-22 MED ORDER — SODIUM CHLORIDE 0.9 % IV SOLN
INTRAVENOUS | Status: AC
  Administered 2017-06-23 (×3): via INTRAVENOUS

## 2017-06-22 MED ORDER — LEVOFLOXACIN IN D5W 500 MG/100ML IV SOLN
500.0000 mg | Freq: Once | INTRAVENOUS | Status: AC
  Administered 2017-06-22: 500 mg via INTRAVENOUS
  Filled 2017-06-22: qty 100

## 2017-06-22 MED ORDER — LORAZEPAM 2 MG/ML IJ SOLN: 1.0000 mg | Freq: Four times a day (QID) | INTRAMUSCULAR | Status: AC | PRN

## 2017-06-22 MED ORDER — DEXTROSE 5 % IV SOLN
1.0000 g | INTRAVENOUS | Status: DC
  Administered 2017-06-23: 1 g via INTRAVENOUS
  Filled 2017-06-22 (×2): qty 10

## 2017-06-22 MED ORDER — LEVOFLOXACIN IN D5W 750 MG/150ML IV SOLN: 750.0000 mg | INTRAVENOUS | Status: DC

## 2017-06-22 MED ORDER — ADULT MULTIVITAMIN W/MINERALS CH
1.0000 | ORAL_TABLET | Freq: Every day | ORAL | Status: DC
  Administered 2017-06-23 – 2017-06-26 (×4): 1 via ORAL
  Filled 2017-06-22 (×5): qty 1

## 2017-06-22 MED ORDER — THIAMINE HCL 100 MG/ML IJ SOLN
100.0000 mg | Freq: Once | INTRAMUSCULAR | Status: AC
  Administered 2017-06-22: 100 mg via INTRAVENOUS
  Filled 2017-06-22: qty 2

## 2017-06-22 MED ORDER — ADULT MULTIVITAMIN W/MINERALS CH
1.0000 | ORAL_TABLET | Freq: Once | ORAL | Status: AC
  Administered 2017-06-22: 1 via ORAL
  Filled 2017-06-22: qty 1

## 2017-06-22 MED ORDER — FOLIC ACID 1 MG PO TABS
1.0000 mg | ORAL_TABLET | Freq: Once | ORAL | Status: AC
  Administered 2017-06-22: 1 mg via ORAL
  Filled 2017-06-22: qty 1

## 2017-06-22 MED ORDER — SUCRALFATE 1 GM/10ML PO SUSP
1.0000 g | Freq: Three times a day (TID) | ORAL | Status: DC
  Administered 2017-06-22 – 2017-06-23 (×3): 1 g via ORAL
  Filled 2017-06-22 (×3): qty 10

## 2017-06-22 MED ORDER — LEVOFLOXACIN IN D5W 250 MG/50ML IV SOLN
250.0000 mg | Freq: Once | INTRAVENOUS | Status: AC
  Administered 2017-06-22: 250 mg via INTRAVENOUS
  Filled 2017-06-22: qty 50

## 2017-06-22 MED ORDER — THIAMINE HCL 100 MG/ML IJ SOLN
100.0000 mg | Freq: Every day | INTRAMUSCULAR | Status: DC
  Filled 2017-06-22: qty 2

## 2017-06-22 MED ORDER — AZITHROMYCIN 250 MG PO TABS
500.0000 mg | ORAL_TABLET | Freq: Every day | ORAL | Status: DC
  Administered 2017-06-23: 500 mg via ORAL
  Filled 2017-06-22: qty 2

## 2017-06-22 MED ORDER — ONDANSETRON 4 MG PO TBDP: 4.0000 mg | ORAL_TABLET | Freq: Three times a day (TID) | ORAL | Status: DC | PRN

## 2017-06-22 MED ORDER — LORAZEPAM 1 MG PO TABS: 1.0000 mg | ORAL_TABLET | Freq: Four times a day (QID) | ORAL | Status: AC | PRN

## 2017-06-22 MED ORDER — FOLIC ACID 1 MG PO TABS
1.0000 mg | ORAL_TABLET | Freq: Every day | ORAL | Status: DC
  Administered 2017-06-23 – 2017-06-26 (×4): 1 mg via ORAL
  Filled 2017-06-22 (×5): qty 1

## 2017-06-22 MED ORDER — PANTOPRAZOLE SODIUM 40 MG PO TBEC
40.0000 mg | DELAYED_RELEASE_TABLET | Freq: Two times a day (BID) | ORAL | Status: DC
  Administered 2017-06-22 – 2017-06-26 (×8): 40 mg via ORAL
  Filled 2017-06-22 (×9): qty 1

## 2017-06-22 MED ORDER — SODIUM CHLORIDE 0.9 % IV BOLUS (SEPSIS)
1000.0000 mL | Freq: Once | INTRAVENOUS | Status: AC
  Administered 2017-06-22: 1000 mL via INTRAVENOUS

## 2017-06-22 NOTE — ED Notes (Signed)
Pt still unable to provide urine sample 

## 2017-06-22 NOTE — Progress Notes (Deleted)
Date: 06/22/2017               Patient Name:  Raymond Moreno MRN: [id number]  DOB: 1955-09-27 Age / Sex: 61 y.o., male   PCP: Shawnee Knapp, MD         Medical Service: Internal Medicine Teaching Service         Attending Physician: Dr. Evette Doffing, Mallie Mussel, *    First Contact: Dr. Thomasene Ripple Pager: [phone]  Second Contact: Dr. Kalman Shan Pager: [phone]       After Hours (After 5p/  First Contact Pager: [phone]  weekends / holidays): Second Contact Pager: [phone]   Chief Complaint: Cough, weakness, shortness of breath  History of Present Illness:  Raymond Moreno is a 61 yo who presented with a multi-week history of nausea, general malaise, and cough that acutely got worse in the past few days. His symptoms have progressed to the point were he can no longer eat or drink anything 2/2 worsening nausea. The patient states that the last thing he ate was a piece of pizza two days ago. He noticed that about 30 minutes after eating the pizza he threw up what looked like digested food. He denies hematemesis. He states that he just doesn't feel like eating anymore. He denies dysphagia, food getting stuck in his throat, and coughing after drinking/eating. He doesn't endorse any abdominal pain after eating, diarrhea, bright red blood in stool, or dark/tarry stools. Since he hasn't been eating well for sometime, he feels overall fatigued. Mores specifically the fatigue is settling in his legs and he feels overall weak like he can no longer walk. He states that his fatigue has gotten so bad that he hasn't left his house in a few days. Also during the past few days the patient notices a cough that produces a white sputum. He denies fevers and chills. He does not endorse SOB, pleuritic chest pain, or dyspnea on exertion.   Patient states that he drinks alcohol daily and usually consumes about 1 gallon of vodka per week. He states that he hasn't been able to drink as much lately 2/2 feeling  so weak and nausea. His last drink was reported to be about 2 weeks ago.   On arrival the patient was afebrile and hemodynamically stable. In the ED the patient was found to have a leukocytosis to 25, an elevated lactate of 2.5, and a LLL infiltrate. He was also found to have an elevated creatinine to 1.76 and received 2L NS.   Meds:  No outpatient prescriptions have been marked as taking for the 06/22/17 encounter [facility] Encounter).   Allergies: Allergies as of 06/22/2017  . (No Known Allergies)   Past Medical History: Past Medical History:  Diagnosis Date  . Hyperlipidemia   . Hypertension    Past Surgical History: Past Surgical History:  Procedure Laterality Date  . BUNIONECTOMY     left foot  . KNEE ARTHROSCOPY     right knee   Family History:  Family History  Problem Relation Age of Onset  . Heart disease Mother   . 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 He states that he smokes about 4 cigarettes a day and has for "many years". He also states that he drinks alcohol daily and reports on average that he consumes 1 gallon of vodka per week. Denies illicit drug use.  Review of Systems: A complete ROS was negative except as per HPI.   Physical Exam: Blood pressure 131/87, pulse 79, temperature 97.7 F (36.5 C), temperature source Oral, resp. rate 20, SpO2 97 %.  Physical Exam  Constitutional:  Elderly man laying in bed in no acute distress  HENT:  Mouth/Throat: Oropharynx is clear and moist.  Cardiovascular: Normal rate, regular rhythm and normal heart sounds.  Exam reveals no friction rub.   No murmur heard. Pulmonary/Chest: No respiratory distress. He has no wheezes.  No crackles appreciated  Abdominal: Soft. He exhibits no distension. There is no tenderness. There is no rebound.  Musculoskeletal: He exhibits no edema or tenderness.  Skin: Skin is warm and dry. No rash  noted. No erythema.   CXR: personally reviewed my interpretation is LLL infiltration without associated pleural effusions.   Assessment & Plan by Problem: Active Problems:   Pneumonia  Raymond Moreno is a 61 yo with a PMH of HTN and HLD who presented with a multi-week history of worsening fatigue, nausea, and cough. In the ED he was found to have an elevated lactate, AKI, and LLL infiltrate. The patient was admitted to the internal medicine teaching service for admission. The specific problems addressed during admission were as follows:  Community acquired pneumonia, LLL: Patient reports overall fatigue, nausea, and cough over the past few months. The patient states that his symptoms have progressively gotten worse. Patient afebrile and other vital signs within normal limits. However a LLL infiltrate on CXR and leukocytosis to 25.1 suggest pneumonia. This is most likely a community acquired pneumonia, however the patient has alcoholism which is a risk factor for pneumonia. Patient denies dysphagia and difficulty swallowing. Will hold off covering for aspiration pneumonia with Unasyn at this point, but will reassess in morning. If patient's labs and clinical appearance do not improve on Levaquin for CAP, will add Unasyn to cover for anaerobes. If patient shows overall improvement, will transition to oral levaquin for treatment to see if he can tolerate these medications in the setting of increased nausea.  -Continue Levaquin 750 mg q24 hours (day 1/5 on 06/22/17) -Follow up repeat lactate -Follow up procalcitonin on admission -Repeat CBC in AM -Patient will need follow up CXR for incidentally observed R hemidiaphragm elevation and resolution of pneumonia on admission  Probable AKI: Patient's last creatine per chart review is 1.28. Patient's creatinine elevated to 1.76 on admission. While we do not have a good baseline kidney function, this elevation in combination with decreased PO intake and  elevated lactate, suggests AKI (likely prerenal in origin). Patient already received 2L NS in ED so will provide maintenance IVF and obtain BMP in AM to reassess kidney function. -BMP in AM -Continue NS @ rate of 100 ml/hr  Hypoalbuminemia: Patient reports decreased PO intake and subjective unintentional weight loss as a result. Patient was found to have albumin of 2.7, decreased from >4 1 year ago. Patient drinks alcohol daily, suggesting decreased albumin may be 2/2 decreased synthesis, however no signs of cirrhosis on exam and AST/ALT within normal limits. Given steady increase in creatinine over time, it is possible that patient has baseline kidney disease with nephrotic syndrome leading to hypoalbuminemia. Will follow up protein levels in urinalysis to assess.  -Follow up urinalysis in AM  Nausea/vomiting: Patient denies hematemesis and states that his symptoms occur whenever he has a meal. He denies abdominal pain with eating, which is reassuring that the patient does not have mesenteric ischemia. The patient denies symptoms of  GERD, including burning chest pain and increased chest pressure. Patient's lab and physical exam was not suspicious for pancreatitis. It is most likely that the patient has gastritis, possibly 2/2 daily alcohol use. Will provide supportive care while inpatient to see if this improves symptoms. -Carafate, 1 g TID with meals -Ondansetron 4 mg ODT  Macrocytic anemia: Patient reports chronic alcohol use at baseline as discussed below. Patient's CBC on arrival showed MCV = 108 and new anemia not previously observed on labs 7 months ago. -Follow up K10, RBC folate -Folic acid 1 mg daily -Daily multivitamin  Hx of alcohol abuse: Patient drinks alcohol daily, citing that he regularly drinks 1 gallon of vodka per week. Patient states that last drink was 2 weeks ago. No history of alcohol withdrawal or withdrawal seizures.  -CIWA without ativan -Ethanol <10 on  admission  Fluids: NS @ rate of 100 ml/hr VTE prophylaxis: SCD's Diet: Regular  Dispo: Admit patient to Observation with expected length of stay less than 2 midnights.  SignedThomasene Ripple, MD 06/22/2017, 8:08 PM  Pager: [phone]

## 2017-06-22 NOTE — ED Notes (Signed)
I stat lactic acid results given to Dr. Thomasene Lot by B. Yolanda Bonine, EMT

## 2017-06-22 NOTE — ED Triage Notes (Signed)
Pt from home with EMS c/o weakness for the past 2-3 weeks, EMS reports several empty bottles of vodka in his house, pt states he has not had anything to drink in a week and also reports he hasnt had much to eat in the past week as well. Hx of HTN but has not had any BP meds since May. BP 122/78 sinus on the monitor. CBG 146 20GLAC. Pt a.o, nad

## 2017-06-22 NOTE — ED Notes (Signed)
Pt in xray

## 2017-06-22 NOTE — ED Notes (Signed)
Pt states his last drink was 2 weeks ago. Pt states he usually drinks about 1 gallon of vodka a week

## 2017-06-22 NOTE — ED Notes (Signed)
Ordered dinner 

## 2017-06-22 NOTE — H&P (Signed)
Date: 06/22/2017               Patient Name:  Raymond Moreno MRN: [id number]  DOB: [birth year] Age / Sex: 61 y.o., male   PCP: Shawnee Knapp, MD         Medical Service: Internal Medicine Teaching Service         Attending Physician: Dr. Evette Doffing, Mallie Mussel, *    First Contact: Dr. Thomasene Ripple Pager: [phone]  Second Contact: Dr. Kalman Shan Pager: [phone]       After Hours (After 5p/  First Contact Pager: [phone]  weekends / holidays): Second Contact Pager: [phone]   Chief Complaint: Cough, weakness, shortness of breath  History of Present Illness:  Raymond Moreno is a 61 yo who presented with a multi-week history of nausea, general malaise, and cough that acutely got worse in the past few days. His symptoms have progressed to the point were he can no longer eat or drink anything 2/2 worsening nausea. The patient states that the last thing he ate was a piece of pizza two days ago. He noticed that about 30 minutes after eating the pizza he threw up what looked like digested food. He denies hematemesis. He states that he just doesn't feel like eating anymore. He denies dysphagia, food getting stuck in his throat, and coughing after drinking/eating. He doesn't endorse any abdominal pain after eating, diarrhea, bright red blood in stool, or dark/tarry stools. Since he hasn't been eating well for sometime, he feels overall fatigued. Mores specifically the fatigue is settling in his legs and he feels overall weak like he can no longer walk. He states that his fatigue has gotten so bad that he hasn't left his house in a few days. Also during the past few days the patient notices a cough that produces a white sputum. He denies fevers and chills. He does not endorse SOB, pleuritic chest pain, or dyspnea on exertion.   Patient states that he drinks alcohol daily and usually consumes about 1 gallon of vodka per week. He states that he hasn't been able to drink as much lately 2/2 feeling  so weak and nausea. His last drink was reported to be about 2 weeks ago.   On arrival the patient was afebrile and hemodynamically stable. In the ED the patient was found to have a leukocytosis to 25, an elevated lactate of 2.5, and a LLL infiltrate. He was also found to have an elevated creatinine to 1.76 and received 2L NS.   Meds:  No outpatient prescriptions have been marked as taking for the 06/22/17 encounter [facility] Encounter).   Allergies: Allergies as of 06/22/2017  . (No Known Allergies)   Past Medical History: Past Medical History:  Diagnosis Date  . Hyperlipidemia   . Hypertension    Past Surgical History: Past Surgical History:  Procedure Laterality Date  . BUNIONECTOMY     left foot  . KNEE ARTHROSCOPY     right knee   Family History:  Family History  Problem Relation Age of Onset  . Heart disease Mother   . 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 He states that he smokes about 4 cigarettes a day and has for "many years". He also states that he drinks alcohol daily and reports on average that he consumes 1 gallon of vodka per week. Denies illicit drug use.  Review of Systems: A complete ROS was negative except as per HPI.   Physical Exam: Blood pressure 131/87, pulse 79, temperature 97.7 F (36.5 C), temperature source Oral, resp. rate 20, SpO2 97 %.  Physical Exam  Constitutional:  Elderly man laying in bed in no acute distress  HENT:  Mouth/Throat: Oropharynx is clear and moist.  Cardiovascular: Normal rate, regular rhythm and normal heart sounds.  Exam reveals no friction rub.   No murmur heard. Pulmonary/Chest: No respiratory distress. He has no wheezes.  No crackles appreciated  Abdominal: Soft. He exhibits no distension. There is no tenderness. There is no rebound.  Musculoskeletal: He exhibits no edema or tenderness.  Skin: Skin is warm and dry. No rash  noted. No erythema.   CXR: personally reviewed my interpretation is LLL infiltration without associated pleural effusions.   Assessment & Plan by Problem: Active Problems:   Pneumonia  Raymond Moreno is a 61 yo with a PMH of HTN and HLD who presented with a multi-week history of worsening fatigue, nausea, and cough. In the ED he was found to have an elevated lactate, AKI, and LLL infiltrate. The patient was admitted to the internal medicine teaching service for admission. The specific problems addressed during admission were as follows:  Community acquired pneumonia, LLL: Patient reports overall fatigue, nausea, and cough over the past few months. The patient states that his symptoms have progressively gotten worse. Patient afebrile and other vital signs within normal limits. However a LLL infiltrate on CXR and leukocytosis to 25.1 suggest pneumonia. This is most likely a community acquired pneumonia, however the patient has alcoholism which is a risk factor for pneumonia. Patient denies dysphagia and difficulty swallowing. Will hold off covering for aspiration pneumonia with Unasyn at this point, but will reassess in morning. If patient's labs and clinical appearance do not improve on Levaquin for CAP, will add Unasyn to cover for anaerobes. If patient shows overall improvement, will transition to oral levaquin for treatment to see if he can tolerate these medications in the setting of increased nausea.  -Continue Levaquin 750 mg q24 hours (day 1/5 on 06/22/17) -Follow up repeat lactate -Follow up procalcitonin on admission -Repeat CBC in AM -Patient will need follow up CXR for incidentally observed R hemidiaphragm elevation and resolution of pneumonia on admission  Probable AKI: Patient's last creatine per chart review is 1.28. Patient's creatinine elevated to 1.76 on admission. While we do not have a good baseline kidney function, this elevation in combination with decreased PO intake and  elevated lactate, suggests AKI (likely prerenal in origin). Patient already received 2L NS in ED so will provide maintenance IVF and obtain BMP in AM to reassess kidney function. -BMP in AM -Continue NS @ rate of 100 ml/hr  Hypoalbuminemia: Patient reports decreased PO intake and subjective unintentional weight loss as a result. Patient was found to have albumin of 2.7, decreased from >4 1 year ago. Patient drinks alcohol daily, suggesting decreased albumin may be 2/2 decreased synthesis, however no signs of cirrhosis on exam and AST/ALT within normal limits. Given steady increase in creatinine over time, it is possible that patient has baseline kidney disease with nephrotic syndrome leading to hypoalbuminemia. Will follow up protein levels in urinalysis to assess.  -Follow up urinalysis in AM  Nausea/vomiting: Patient denies hematemesis and states that his symptoms occur whenever he has a meal. He denies abdominal pain with eating, which is reassuring that the patient does not have mesenteric ischemia. The patient denies symptoms of  GERD, including burning chest pain and increased chest pressure. Patient's lab and physical exam was not suspicious for pancreatitis. It is most likely that the patient has gastritis, possibly 2/2 daily alcohol use. Will provide supportive care while inpatient to see if this improves symptoms. -Carafate, 1 g TID with meals -Ondansetron 4 mg ODT  Macrocytic anemia: Patient reports chronic alcohol use at baseline as discussed below. Patient's CBC on arrival showed MCV = 108 and new anemia not previously observed on labs 7 months ago. -Follow up O24, RBC folate -Folic acid 1 mg daily -Daily multivitamin  Hx of alcohol abuse: Patient drinks alcohol daily, citing that he regularly drinks 1 gallon of vodka per week. Patient states that last drink was 2 weeks ago. No history of alcohol withdrawal or withdrawal seizures.  -CIWA without ativan -Ethanol <10 on  admission  Fluids: NS @ rate of 100 ml/hr VTE prophylaxis: SCD's Diet: Regular  Dispo: Admit patient to Observation with expected length of stay less than 2 midnights.  SignedThomasene Ripple, MD 06/22/2017, 8:08 PM  Pager: [phone]

## 2017-06-22 NOTE — ED Notes (Signed)
Attempted report x1. 

## 2017-06-22 NOTE — ED Provider Notes (Addendum)
[place] EMERGENCY DEPARTMENT Provider Note   CSN: [id number] Arrival date & time: 06/22/17  1320     History   Chief Complaint Chief Complaint  Patient presents with  . Weakness    HPI Raymond Moreno is a 61 y.o. male.  HPI   Patient's 61 year old male presenting with feelings of fatigue. Patient reports that he drinks roughly 1 gallon of vodka a week. He reports he has not had any alcohol in last week. Patient denies seeing a regular physician, says he usually goes to urgent care. Patient just feels like he's been unable to eat with mild nausea . He's never seen a GI physician previously.  Denies any black vomit or black stool. Denies any diarrhea. Endorses mild nausea when eating and occasional vomiting.  Past Medical History:  Diagnosis Date  . Hyperlipidemia   . Hypertension     Patient Active Problem List   Diagnosis Date Noted  . Tremor of both hands 01/07/2015  . Vision loss of left eye 01/07/2015  . Smoker 01/07/2015  . Adult body mass index 37.0-37.9 01/04/2012  . HTN (hypertension) 01/04/2012  . Hyperlipemia 01/04/2012    Past Surgical History:  Procedure Laterality Date  . BUNIONECTOMY     left foot  . KNEE ARTHROSCOPY     right knee       Home Medications    Prior to Admission medications   Medication Sig Start Date End Date Taking? Authorizing Provider  amLODipine (NORVASC) 5 MG tablet TAKE 1 TABLET BY MOUTH EVERY DAY 04/28/17   Gale Journey, Damaris Hippo, PA-C  aspirin 81 MG tablet Take 81 mg by mouth daily.    [provider]  atorvastatin (LIPITOR) 40 MG tablet TAKE 1 TABLET BY MOUTH EVERY DAY 04/28/17   Gale Journey, Damaris Hippo, PA-C  colchicine 0.6 MG tablet Take 1 tablet (0.6 mg total) by mouth daily. 10/24/16   Scot Jun, FNP  metoprolol succinate (TOPROL-XL) 50 MG 24 hr tablet TAKE 1 TABLET BY MOUTH DAILY. TAKE WITH OR IMMEDIATELY FOLLOWING A MEAL. 04/28/17   Gale Journey, Damaris Hippo, PA-C  predniSONE (DELTASONE) 20 MG tablet  Take 3 PO QAM x3days, 2 PO QAM x3days, 1 PO QAM x3days 10/24/16   Scot Jun, FNP    Family History Family History  Problem Relation Age of Onset  . Heart disease Mother   . Hypertension Sister     Social History Social History  Substance Use Topics  . Smoking status: Current Every Day Smoker    Packs/day: 0.50    Years: 43.00    Types: Cigarettes  . Smokeless tobacco: Not on file  . Alcohol use 0.0 oz/week     Allergies   Patient has no known allergies.   Review of Systems Review of Systems  Constitutional: Positive for fatigue. Negative for activity change and fever.  Respiratory: Positive for cough. Negative for shortness of breath.   Cardiovascular: Negative for chest pain.  Gastrointestinal: Positive for nausea and vomiting. Negative for abdominal pain.     Physical Exam Updated Vital Signs BP (!) 125/96 (BP Location: Right Arm)   Pulse 81   Temp 97.7 F (36.5 C) (Oral)   Resp 16   SpO2 100%   Physical Exam  Constitutional: He is oriented to person, place, and time. He appears well-nourished.  HENT:  Head: Normocephalic.  Unkempt. Dirt caked onlegs and feet.  Eyes: Conjunctivae are normal. Right eye exhibits no discharge. Left eye exhibits no discharge.  And  nystagmus baseline.  Cardiovascular: Normal rate.   Pulmonary/Chest: Effort normal and breath sounds normal.  Abdominal: Soft. There is no tenderness. There is no rebound and no guarding.  Neurological: He is oriented to person, place, and time.  Skin: Skin is warm and dry. He is not diaphoretic.  Psychiatric: He has a normal mood and affect. His behavior is normal.     ED Treatments / Results  Labs (all labs ordered are listed, but only abnormal results are displayed) Labs Reviewed  COMPREHENSIVE METABOLIC PANEL  CBC WITH DIFFERENTIAL/PLATELET  LIPASE, BLOOD  ETHANOL  URINALYSIS, ROUTINE W REFLEX MICROSCOPIC  I-STAT TROPONIN, ED  I-STAT CG4 LACTIC ACID, ED    EKG  EKG  Interpretation None       Radiology No results found.  Procedures Procedures (including critical care time)  Medications Ordered in ED Medications  sodium chloride 0.9 % bolus 1,000 mL (not administered)  multivitamin with minerals tablet 1 tablet (not administered)  thiamine (B-1) injection 100 mg (not administered)  folic acid (FOLVITE) tablet 1 mg (not administered)     Initial Impression / Assessment and Plan / ED Course  I have reviewed the triage vital signs and the nursing notes.  Pertinent labs & imaging results that were available during my care of the patient were reviewed by me and considered in my medical decision making (see chart for details).    Patient 61 year old male who appears very unkempt.presenting with feelings of fatigue. Patient reports that he drinks roughly 1 gallon of vodka a week. He reports he has not had any alcohol in last week. Patient denies seeing a regular physician, says he usually goes to urgent care. Patient just feels like he's been unable to eat with mild nausea . He's never seen a GI physician previously.  Denies any black vomit or black stool. Denies any diarrhea. Endorses mild nausea when eating and occasional vomiting.   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 We will do labs today. Patient has no tenderness on exam. We'll give vitamins and thiamine given an chronic alcohol use. \ 3:26 PM X-ray shows lower lobe infiltrate. Given patient's weakness, elevated lactic and evidence of pneumonia will admit for IV antibiotics, fluids.  CRITICAL CARE Performed by: Gardiner Sleeper Total critical care time: 45 minutes Critical care time was exclusive of separately billable procedures and treating other patients. Critical care was necessary to treat or prevent imminent or life-threatening deterioration. Critical care was time spent personally by me on the  following activities: development of treatment plan with patient and/or surrogate as well as nursing, discussions with consultants, evaluation of patient's response to treatment, examination of patient, obtaining history from patient or surrogate, ordering and performing treatments and interventions, ordering and review of laboratory studies, ordering and review of radiographic studies, pulse oximetry and re-evaluation of patient's condition.   Final Clinical Impressions(s) / ED Diagnoses   Final diagnoses:  None    New Prescriptions New Prescriptions   No medications on file     Macarthur Critchley, MD 06/22/17 1527    Macarthur Critchley, MD 06/22/17 1531

## 2017-06-23 ENCOUNTER — Inpatient Hospital Stay (HOSPITAL_COMMUNITY): Payer: BC Managed Care – PPO

## 2017-06-23 ENCOUNTER — Encounter (HOSPITAL_COMMUNITY): Payer: Self-pay | Admitting: General Practice

## 2017-06-23 LAB — CBC
HEMATOCRIT: 29.8 % — AB (ref 39.0–52.0)
HEMOGLOBIN: 9.8 g/dL — AB (ref 13.0–17.0)
MCH: 35.9 pg — ABNORMAL HIGH (ref 26.0–34.0)
MCHC: 32.9 g/dL (ref 30.0–36.0)
MCV: 109.2 fL — AB (ref 78.0–100.0)
Platelets: 458 10*3/uL — ABNORMAL HIGH (ref 150–400)
RBC: 2.73 MIL/uL — ABNORMAL LOW (ref 4.22–5.81)
RDW: 20.1 % — ABNORMAL HIGH (ref 11.5–15.5)
WBC: 19 10*3/uL — AB (ref 4.0–10.5)

## 2017-06-23 LAB — URINALYSIS, ROUTINE W REFLEX MICROSCOPIC
Bilirubin Urine: NEGATIVE
Glucose, UA: NEGATIVE mg/dL
Hgb urine dipstick: NEGATIVE
Ketones, ur: NEGATIVE mg/dL
LEUKOCYTES UA: NEGATIVE
NITRITE: NEGATIVE
PH: 5 (ref 5.0–8.0)
Protein, ur: NEGATIVE mg/dL
SPECIFIC GRAVITY, URINE: 1.017 (ref 1.005–1.030)

## 2017-06-23 LAB — COMPREHENSIVE METABOLIC PANEL
ALBUMIN: 2.2 g/dL — AB (ref 3.5–5.0)
ALT: 8 U/L — ABNORMAL LOW (ref 17–63)
ANION GAP: 12 (ref 5–15)
AST: 15 U/L (ref 15–41)
Alkaline Phosphatase: 100 U/L (ref 38–126)
BILIRUBIN TOTAL: 0.6 mg/dL (ref 0.3–1.2)
BUN: 34 mg/dL — ABNORMAL HIGH (ref 6–20)
CHLORIDE: 92 mmol/L — AB (ref 101–111)
CO2: 27 mmol/L (ref 22–32)
Calcium: 8.6 mg/dL — ABNORMAL LOW (ref 8.9–10.3)
Creatinine, Ser: 1.61 mg/dL — ABNORMAL HIGH (ref 0.61–1.24)
GFR calc Af Amer: 52 mL/min — ABNORMAL LOW (ref >60)
GFR calc non Af Amer: 45 mL/min — ABNORMAL LOW (ref >60)
GLUCOSE: 99 mg/dL (ref 65–99)
POTASSIUM: 3.9 mmol/L (ref 3.5–5.1)
Sodium: 131 mmol/L — ABNORMAL LOW (ref 135–145)
TOTAL PROTEIN: 6.1 g/dL — AB (ref 6.5–8.1)

## 2017-06-23 LAB — HIV ANTIBODY (ROUTINE TESTING W REFLEX): HIV SCREEN 4TH GENERATION: NONREACTIVE

## 2017-06-23 LAB — VITAMIN B12: VITAMIN B 12: 368 pg/mL (ref 180–914)

## 2017-06-23 LAB — PSA: PROSTATIC SPECIFIC ANTIGEN: 0.25 ng/mL (ref 0.00–4.00)

## 2017-06-23 LAB — PROCALCITONIN: Procalcitonin: 0.27 ng/mL

## 2017-06-23 MED ORDER — PNEUMOCOCCAL VAC POLYVALENT 25 MCG/0.5ML IJ INJ
0.5000 mL | INJECTION | INTRAMUSCULAR | Status: AC
  Administered 2017-06-24: 0.5 mL via INTRAMUSCULAR
  Filled 2017-06-23: qty 0.5

## 2017-06-23 MED ORDER — INFLUENZA VAC SPLIT QUAD 0.5 ML IM SUSY
0.5000 mL | PREFILLED_SYRINGE | INTRAMUSCULAR | Status: AC
  Administered 2017-06-24: 0.5 mL via INTRAMUSCULAR
  Filled 2017-06-23: qty 0.5

## 2017-06-23 MED ORDER — IOPAMIDOL (ISOVUE-300) INJECTION 61%
INTRAVENOUS | Status: AC
  Administered 2017-06-23: 75 mL
  Filled 2017-06-23: qty 75

## 2017-06-23 MED ORDER — ENSURE ENLIVE PO LIQD
237.0000 mL | Freq: Two times a day (BID) | ORAL | Status: DC
  Administered 2017-06-24 – 2017-06-25 (×3): 237 mL via ORAL

## 2017-06-23 MED ORDER — BOOST / RESOURCE BREEZE PO LIQD
1.0000 | Freq: Every day | ORAL | Status: DC
  Administered 2017-06-23 – 2017-06-24 (×2): 1 via ORAL

## 2017-06-23 NOTE — Progress Notes (Signed)
Initial Nutrition Assessment  DOCUMENTATION CODES:   Non-severe (moderate) malnutrition in context of chronic illness  INTERVENTION:   -Ensure Enlive po BID, each supplement provides 350 kcal and 20 grams of protein -Boost Breeze po daily, each supplement provides 250 kcal and 9 grams of protein -MVI daily  NUTRITION DIAGNOSIS:   Malnutrition (Moderate) related to chronic illness (ETOH abuse) as evidenced by energy intake < 75% for > or equal to 1 month, mild depletion of muscle mass, moderate depletions of muscle mass.  GOAL:   Patient will meet greater than or equal to 90% of their needs  MONITOR:   PO intake, Supplement acceptance, Labs, Weight trends, Skin, I & O's  REASON FOR ASSESSMENT:   Malnutrition Screening Tool    ASSESSMENT:   Raymond Moreno is a 61 yo with a PMH of HTN and HLD who presented with a multi-week history of worsening fatigue, nausea, and cough. In the ED he was found to have an elevated lactate, AKI, and LLL infiltrate.  Pt admitted with CAP and probable AKI.   Spoke with pt, who reports a general decline in health over the past month. He reports is is active and has a good appetite at baseline, however, intake has significantly diminished to "eating every 3-4 days" to "eating nothing at all over the past week". Pt reports that he has been afraid to eat, due to GI distress, as he often experienced vomiting and diarrhea with eating ("even if I eat just 4 potato chips"). Pt observed with lunch tray today, consumed only bites. He shares that he has been able to tolerate fluids better than solid foods (drank water, juice, and gingerale today).   Pt reports wt loss, but unable to provide amount or time frame for weight loss. Pt has experienced a 11.9% wt loss over the past 8 months. While not significant for time frame, weight loss is concerning especially when coupled with poor oral intake and weakness.   Nutrition-Focused physical exam completed. Findings  are mild to moderate fat depletion, mild to moderate muscle depletion, and no edema.   Discussed importance of good meal and supplement intake to promote healing. Pt amenable to supplements (trialing both Ensure and Boost Breeze to promote acceptance).   Medications reviewed and include folvite, MVI, and thiamine.  Labs reviewed: Na: 131 (on IV supplementation).  Diet Order:  Diet regular Room service appropriate? Yes; Fluid consistency: Thin  Skin:  Reviewed, no issues  Last BM:  06/15/17  Height:   Ht Readings from Last 1 Encounters:  06/23/17 6' (1.829 m)    Weight:   Wt Readings from Last 1 Encounters:  06/23/17 207 lb 6.4 oz (94.1 kg)    Ideal Body Weight:  80.9 kg  BMI:  Body mass index is 28.13 kg/m.  Estimated Nutritional Needs:   Kcal:  2150-2350  Protein:  110-125 grams  Fluid:  > 2.1 L  EDUCATION NEEDS:   Education needs addressed  Raymond Moreno, RD, LDN, CDE Pager: [phone] After hours Pager: [phone]

## 2017-06-23 NOTE — Progress Notes (Signed)
Pt. transported from ER via stretcher to 5W- 34; alert and oriented x4; hasn't voided yet. Pt. oriented to room and call button. Orders reviewed with pt.

## 2017-06-23 NOTE — Progress Notes (Addendum)
Subjective:  Patient states he still feels weak this AM. He notes continued nausea without episodes of emesis overnight. He was able to tolerate a little bit of his dinner last evening and expressed relief that he was glad to have eaten for the first time in a week or so. He denies hemoptysis, but continues to endorse cough and SOB.   Objective:  Vital signs in last 24 hours: Vitals:   06/22/17 2000 06/22/17 2100 06/22/17 2216 06/23/17 0536  BP: 121/87 119/81 104/75 107/77  Pulse: 86 82 (!) 109 83  Resp: (!) 23 (!) '26 18 18  ' Temp:   (!) 97.5 F (36.4 C) 98.2 F (36.8 C)  TempSrc:   Oral Oral  SpO2: 99% 100% 100% 95%   Physical Exam  Constitutional:  Gentleman who appears older than stated age laying comfortably in bed in no acute distress  HENT:  Mouth/Throat: Oropharynx is clear and moist.  Cardiovascular: Normal rate, regular rhythm and intact distal pulses.  Exam reveals no friction rub.   No murmur heard. Pulmonary/Chest: Effort normal. No respiratory distress. He has no wheezes.  Abdominal: Soft. He exhibits no distension. There is tenderness (in RLQ with deep palpation). There is no guarding.  Musculoskeletal: He exhibits no edema (of bilateral lower extremities) or tenderness (of bilateral lower extremities).  Skin: Skin is warm and dry. No erythema.  Patient has multiple macules throughout torso, upper and lower extremities most of which are small and well circumscribed and the largest of which is on the distal aspect of L shin, measures 1x2 cm in dimension without clear borders and no overlying skin changes.    Assessment/Plan:  Active Problems:   Pneumonia  [place] Raymond Moreno is a 61 yo with a PMH of HTN and HLD who presented with a multi-week history of worsening fatigue, nausea, and cough. In the ED he was found to have an elevated lactate, AKI, and LLL infiltrate. The patient was admitted to the internal medicine teaching service for admission. The specific problems  addressed during admission were as follows:  Community acquired pneumonia, LLL: Patient reports improvement in his cough but continues to endorse SOB. Given >30 pack year smoking history, new pneumonia, and patients complaints of worsening unintentional weight loss/general malaise, will obtain Chest CT with contrast to evaluate for possible underlying lung mass.  -Continue ceftriaxone, azithromycin for CAP -Follow up chest CT with contrast  AKI: Patient's last creatine per chart review is 1.28. Patient's creatinine elevated to 1.76 and decreased to 1.6 this AM with 2L NS in ED. -BMP daily -Continue NS @ rate of 100 ml/hr  Hypoalbuminemia with potential elevated protein gap: Patient had elevated protein gap on admission, with symptoms of malaise and unintentional weight loss concerning for underlying malignancy. Will complete labs to assess for multiple myeloma given corrected calcium of 10 mg/dL and suspected underlying kidney disease. -Follow up serum immune fixation and kappa/lambda light chains  Nausea/vomiting: Patient endorses improvement in nausea/vomiting overnight. No resolution of symptoms with carafate overnight. Will continue supportive care of nausea with ondansetron PRN.  -Ondansetron 4 mg PRN  Macrocytic anemia: Patient reports chronic alcohol use at baseline, which is likely the cause of his macrocytic anemia. B12 within normal limits, will continue folate supplementation.  -Folic acid 1 mg and daily multivitam daily  Hx of alcohol abuse: No signs/symptoms of withdrawal overnight. -CIWA without ativan  Fluids: NS @ rate of 100 ml/hr VTE prophylaxis: SCD's Diet: Regular  Dispo: Anticipated discharge in approximately 1-2 day(s).  Thomasene Ripple, MD 06/23/2017, 9:26 AM Pager: [phone]

## 2017-06-23 NOTE — Plan of Care (Signed)
Problem: Education: Goal: Knowledge of [facility] Education information/materials will improve Outcome: Progressing POC and orders reviewed with pt.   

## 2017-06-24 DIAGNOSIS — Z720 Tobacco use: Secondary | ICD-10-CM

## 2017-06-24 DIAGNOSIS — R771 Abnormality of globulin: Secondary | ICD-10-CM

## 2017-06-24 DIAGNOSIS — F1010 Alcohol abuse, uncomplicated: Secondary | ICD-10-CM

## 2017-06-24 DIAGNOSIS — D539 Nutritional anemia, unspecified: Secondary | ICD-10-CM

## 2017-06-24 DIAGNOSIS — E440 Moderate protein-calorie malnutrition: Secondary | ICD-10-CM | POA: Insufficient documentation

## 2017-06-24 DIAGNOSIS — R5381 Other malaise: Secondary | ICD-10-CM

## 2017-06-24 LAB — CBC
HEMATOCRIT: 27.3 % — AB (ref 39.0–52.0)
Hemoglobin: 8.8 g/dL — ABNORMAL LOW (ref 13.0–17.0)
MCH: 35.2 pg — ABNORMAL HIGH (ref 26.0–34.0)
MCHC: 32.2 g/dL (ref 30.0–36.0)
MCV: 109.2 fL — ABNORMAL HIGH (ref 78.0–100.0)
Platelets: 435 10*3/uL — ABNORMAL HIGH (ref 150–400)
RBC: 2.5 MIL/uL — ABNORMAL LOW (ref 4.22–5.81)
RDW: 20.4 % — AB (ref 11.5–15.5)
WBC: 15.7 10*3/uL — AB (ref 4.0–10.5)

## 2017-06-24 LAB — FOLATE RBC
FOLATE, RBC: 816 ng/mL (ref >498)
Folate, Hemolysate: 255.4 ng/mL
Hematocrit: 31.3 % — ABNORMAL LOW (ref 37.5–51.0)

## 2017-06-24 LAB — BASIC METABOLIC PANEL
ANION GAP: 11 (ref 5–15)
BUN: 24 mg/dL — AB (ref 6–20)
CALCIUM: 8.6 mg/dL — AB (ref 8.9–10.3)
CO2: 25 mmol/L (ref 22–32)
Chloride: 98 mmol/L — ABNORMAL LOW (ref 101–111)
Creatinine, Ser: 1.35 mg/dL — ABNORMAL HIGH (ref 0.61–1.24)
GFR calc Af Amer: >60 mL/min (ref >60)
GFR, EST NON AFRICAN AMERICAN: 55 mL/min — AB (ref >60)
GLUCOSE: 100 mg/dL — AB (ref 65–99)
POTASSIUM: 3.4 mmol/L — AB (ref 3.5–5.1)
SODIUM: 134 mmol/L — AB (ref 135–145)

## 2017-06-24 LAB — PROCALCITONIN: PROCALCITONIN: 0.14 ng/mL

## 2017-06-24 LAB — KAPPA/LAMBDA LIGHT CHAINS
KAPPA, LAMDA LIGHT CHAIN RATIO: 1.75 — AB (ref 0.26–1.65)
Kappa free light chain: 60.2 mg/L — ABNORMAL HIGH (ref 3.3–19.4)
Lambda free light chains: 34.4 mg/L — ABNORMAL HIGH (ref 5.7–26.3)

## 2017-06-24 MED ORDER — AMOXICILLIN-POT CLAVULANATE 875-125 MG PO TABS
1.0000 | ORAL_TABLET | Freq: Two times a day (BID) | ORAL | Status: DC
  Administered 2017-06-24 – 2017-06-26 (×5): 1 via ORAL
  Filled 2017-06-24 (×5): qty 1

## 2017-06-24 NOTE — Progress Notes (Signed)
Subjective:  Patient was seen laying comfortably in bed this AM in no acute distress. Even though he had trouble sleeping last night, he states that he is feeling a little better today because he was able to eat more yesterday. He continues to endorse decreased appetite but was encouraged that he didn't have any episodes of nausea/vomiting. He continues to endorse cough with productive sputum, which mainly occurs when he lays too long on his back.  Objective:  Vital signs in last 24 hours: Vitals:   06/23/17 1800 06/23/17 2207 06/23/17 2207 06/24/17 0542  BP: 124/74 110/77  109/62  Pulse: 84 87  90  Resp: '18 17  17  ' Temp: 97.6 F (36.4 C)  98.4 F (36.9 C) 98.2 F (36.8 C)  TempSrc: Oral  Oral Oral  SpO2: 100% 97%  100%  Weight:      Height:       Physical Exam  Constitutional:  Gentleman who appears older than stated age laying comfortably in bed in no acute distress.  Cardiovascular: Normal rate, regular rhythm and intact distal pulses.   No murmur heard. Pulmonary/Chest: Effort normal. No respiratory distress. He has no wheezes.  Abdominal: Soft. He exhibits no distension. There is no tenderness. There is no guarding.  Musculoskeletal: He exhibits no edema (of bilateral lower extremities) or tenderness (of bilateral lower extremities).  Skin: Skin is warm and dry. No erythema. No pallor.    Assessment/Plan:  Principal Problem:   Pneumonia Active Problems:   Macrocytic anemia   Tobacco abuse  Raymond Moreno is a 61 yo with a PMH of HTN and HLD who presented with a multi-week history of worsening fatigue, nausea, and cough. In the ED he was found to have an elevated lactate, AKI, and LLL infiltrate. The patient was admitted to the internal medicine teaching service for admission. The specific problems addressed during admission were as follows:  Cavitary pneumonia, [id number] chest imaging yesterday revealed cavitary pneumonia which will need to be followed with CT  scan in 3 months. Procalcitonin and leukocytosis down trending with current antibiotic coverage. Will cover with Augmentin to treat for strep pneumoniae as well as anaerobes common in aspiration pneumonias.  -Change antibiotics to PO Augmentin (5 days total treatment) -Chest CT in 3 months as outpatient to ensure resolution/no lung lesions  AKI: Patient's creatinine elevated to 1.76 on admission and decreased to 1.35 today, more towards baseline after rehydration with IVF.   Hypoalbuminemia with potential elevated protein gap: Patient had elevated protein gap on admission labs and corrected calcium of 10.2. Working up for multiple myeloma currently, as this could cause his unintentional weight loss and lab abnormalities. Will plan to establish patient with [facility] since he does not currently have a primary care physician with whom he follows so that pending tests can be appropriately followed.  -Follow up multiple myeloma panel as outpatient  Malaise, decreased appetite, unintentional weight loss:Patient continues to endorses improvement in nausea/vomiting and has been able to tolerate more of his meals without any episodes of emesis during hospitalization. Patient continues to endorse overall malaise and weakness. Will obtain PT consult and continue oncology workup as an outpatient (as discussed above).  -Ondansetron 4 mg PRN -PT evaluation prior to discharge to see if patient needs assistive walking devices  Macrocytic anemia:   -Folic acid 1 mg and daily multivitam daily  Hx of alcohol abuse: No signs/symptoms of withdrawal overnight. -CIWA  Fluids: None VTE prophylaxis: SCD's while in bed Diet:Regular  Dispo: Anticipated  discharge in approximately 0-1 day(s).   Thomasene Ripple, MD 06/24/2017, 7:56 AM Pager: [phone]

## 2017-06-24 NOTE — Progress Notes (Signed)
OT Cancellation Note  Patient Details Name: Raymond Moreno MRN: [id number] DOB: 05/05/1956   Cancelled Treatment:    Reason Eval/Treat Not Completed: Other (comment). Social worker in room speaking with pt. Will re-attempt eval at later time.  Almon Register [phone] 06/24/2017, 3:20 PM

## 2017-06-24 NOTE — Progress Notes (Signed)
MD was notified of patient's vital sign changes blood pressure dropped to 97/59, then 92/63, and when manually checked the blood pressure was 86/60. Patient is asymptomatic and I will continue to monitor the patient.

## 2017-06-24 NOTE — Clinical Social Work Note (Signed)
Clinical Social Work Assessment  Patient Details  Name: Raymond Moreno MRN: [id number] Date of Birth: 1956-01-26  Date of referral:  06/24/17               Reason for consult:  Facility Placement                Permission sought to share information with:  Facility Sport and exercise psychologist, Family Supports Permission granted to share information::  Yes, Verbal Permission Granted  Name::     Nurse, children's::  SNFs  Relationship::  Sister  Contact Information:  [phone]  Housing/Transportation Living arrangements for the past 2 months:  Single Family Home Source of Information:  Patient, Engineer, materials, Other (Comment Required) (Sister) Patient Interpreter Needed:  None Criminal Activity/Legal Involvement Pertinent to Current Situation/Hospitalization:  No - Comment as needed Significant Relationships:  Siblings, Friend Lives with:  Self Do you feel safe going back to the place where you live?  No Need for family participation in patient care:  No (Coment)  Care giving concerns:  CSW received consult for possible SNF placement at time of discharge. CSW spoke with patient regarding PT recommendation of SNF placement at time of discharge. Patient reported that he lives alone is currently unable to care for patient at their home given patient's current physical needs and fall risk. Patient expressed understanding of PT recommendation and is agreeable to SNF placement at time of discharge. CSW to continue to follow and assist with discharge planning needs.   Social Worker assessment / plan:  CSW spoke with patient concerning possibility of rehab at [facility] before returning home.  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 Patient gave CSW permission to speak with his friend, Aniceto Boss, and his sisters. They are very concerned for his wellbeing.  Patient/Family's Understanding of and Emotional Response to Diagnosis, Current Treatment, and Prognosis:  Patient/family is realistic regarding therapy needs and expressed being hopeful for SNF placement. Patient expressed understanding of CSW role and discharge process as well as medical condition. No questions/concerns about plan or treatment.    Emotional Assessment Appearance:  Appears stated age Attitude/Demeanor/Rapport:  Other (Appropriate) Affect (typically observed):  Accepting, Appropriate, Pleasant Orientation:  Oriented to Self, Oriented to Situation, Oriented to Place, Oriented to  Time Alcohol / Substance use:  Alcohol Use Psych involvement (Current and /or in the community):  No (Comment)  Discharge Needs  Concerns to be addressed:  Care Coordination Readmission within the last 30 days:  No Current discharge risk:  Dependent with Mobility Barriers to Discharge:  Continued Medical Work up   [employer], [place] 06/24/2017, 5:17 PM

## 2017-06-24 NOTE — Evaluation (Signed)
Physical Therapy Evaluation Patient Details Name: Raymond Moreno MRN: [id number] DOB: 1956/01/19 Today's Date: 06/24/2017   History of Present Illness  Pt is a 61 y/o male admitted secondary to pneumonia. PMH includes HTN, L eye visionloss, tobacco abuse, alcohol abuse, and  R knee arthroscopy.   Clinical Impression  Pt admitted secondary to problem above with deficits below. PTA, pt reports he was independent, however, was limited in ambulation tolerance. Upon eval, pt very weak in BLE (LLE>RLE) and unsteady during functional mobility. Required mod to max A +2 to for transfer to chair this session. Exhibited difficulty powering up into standing as well, even from elevated surface. Pt reports he does not have family assist at home. Pt is currently a high fall risk. Recommending SNF at d/c to increase independence and safety with functional mobility prior to return home. Will continue to follow acutely to maximize independence with functional mobility.     Follow Up Recommendations SNF;Supervision/Assistance - 24 hour    Equipment Recommendations  Wheelchair (measurements PT);Wheelchair cushion (measurements PT);3in1 (PT)    Recommendations for Other Services OT consult     Precautions / Restrictions Precautions Precautions: Fall Restrictions Weight Bearing Restrictions: No      Mobility  Bed Mobility Overal bed mobility: Needs Assistance Bed Mobility: Supine to Sit     Supine to sit: Mod assist     General bed mobility comments: Mod A for trunk elevation and assist with moving LEs to EOB, especially LLE.   Transfers Overall transfer level: Needs assistance Equipment used: Rolling walker (2 wheeled) Transfers: Sit to/from [facility] Sit to Stand: Max assist;+2 physical assistance;From elevated surface Stand pivot transfers: Mod assist;+2 physical assistance       General transfer comment: Attempted to stand with +1 assist from elevated surface X3,  however unable. Nursing student assisted with sit>stand and stand pivot transfers. Required max A +2 for lift assist and mod A +2 during transfer for steadying assist. Required verbal cues for sequencing with RW.   Ambulation/Gait             General Gait Details: NT  Stairs            Wheelchair Mobility    Modified Rankin (Stroke Patients Only)       Balance Overall balance assessment: Needs assistance Sitting-balance support: No upper extremity supported;Feet supported Sitting balance-Leahy Scale: Fair     Standing balance support: Bilateral upper extremity supported;During functional activity Standing balance-Leahy Scale: Poor Standing balance comment: Reliant on RW and external assist to stand.                              Pertinent Vitals/Pain Pain Assessment: No/denies pain    Home Living Family/patient expects to be discharged to:: Private residence Living Arrangements: Alone Available Help at Discharge: Other (Comment) (reports he has no one to check on him ) Type of Home: House Home Access: Stairs to enter Entrance Stairs-Rails: Left Entrance Stairs-Number of Steps: 2 Home Layout: One level Home Equipment: Crutches      Prior Function Level of Independence: Independent         Comments: Reports he was independent, however, was very limited in the distance he was able to walk.      Hand Dominance   Dominant Hand: Right    Extremity/Trunk Assessment   Upper Extremity Assessment Upper Extremity Assessment: Defer to OT evaluation    Lower Extremity Assessment Lower  Extremity Assessment: LLE deficits/detail;RLE deficits/detail RLE Deficits / Details: Grossly 3/5 in knee extensors and DF, however, in hip flexors 2+/5. Reports numbness in lateral thigh.  LLE Deficits / Details: Grossly 2/5 in knee extensors and PF. 2-/5 in hip flexors.     Cervical / Trunk Assessment Cervical / Trunk Assessment: Kyphotic  Communication    Communication: No difficulties  Cognition Arousal/Alertness: Awake/alert Behavior During Therapy: WFL for tasks assessed/performed Overall Cognitive Status: Within Functional Limits for tasks assessed                                        General Comments      Exercises     Assessment/Plan    PT Assessment Patient needs continued PT services  PT Problem List Decreased strength;Decreased balance;Decreased mobility;Decreased knowledge of use of DME;Decreased knowledge of precautions;Impaired sensation;Decreased activity tolerance       PT Treatment Interventions DME instruction;Gait training;Stair training;Functional mobility training;Therapeutic activities;Balance training;Therapeutic exercise;Neuromuscular re-education;Patient/family education    PT Goals (Current goals can be found in the Care Plan section)  Acute Rehab PT Goals Patient Stated Goal: to get stronger  PT Goal Formulation: With patient Time For Goal Achievement: 07/08/17 Potential to Achieve Goals: Good    Frequency Min 3X/week   Barriers to discharge Decreased caregiver support Reports no one is available to assist at home     Co-evaluation               AM-PAC PT "6 Clicks" Daily Activity  Outcome Measure Difficulty turning over in bed (including adjusting bedclothes, sheets and blankets)?: Unable Difficulty moving from lying on back to sitting on the side of the bed? : Unable Difficulty sitting down on and standing up from a chair with arms (e.g., wheelchair, bedside commode, etc,.)?: Unable Help needed moving to and from a bed to chair (including a wheelchair)?: A Lot Help needed walking in hospital room?: A Lot Help needed climbing 3-5 steps with a railing? : Total 6 Click Score: 8    End of Session Equipment Utilized During Treatment: Gait belt Activity Tolerance: Patient tolerated treatment well Patient left: in chair;with call bell/phone within reach;with nursing/sitter  in room Nurse Communication: Mobility status PT Visit Diagnosis: Muscle weakness (generalized) (M62.81);Unsteadiness on feet (R26.81);Difficulty in walking, not elsewhere classified (R26.2)    Time: 5784-6962 PT Time Calculation (min) (ACUTE ONLY): 27 min   Charges:   PT Evaluation $PT Eval Moderate Complexity: 1 Mod PT Treatments $Therapeutic Activity: 8-22 mins   PT G Codes:        Leighton Ruff, PT, DPT  Acute Rehabilitation Services  Pager: [phone]   Rudean Hitt 06/24/2017, 12:05 PM

## 2017-06-24 NOTE — Care Management Note (Signed)
Case Management Note  Patient Details  Name: Raymond Moreno MRN: [id number] Date of Birth: 23-Mar-1956  Subjective/Objective:      Admitted secondary to pneumonia. PMH includes HTN, L eye visionloss, tobacco abuse, alcohol abuse, and  R knee arthroscopy.             Maceo Pro (Sister) Neoma Laming (Sister)      [phone]          PCP:Eva Brigitte Pulse  Action/Plan: Per PT's evaluation: SNF;Supervision/Assistance - 24 hour. Referral made to CSW per CM.  Expected Discharge Date:    06/24/2017           Expected Discharge Plan:  Skilled Nursing Facility  In-House Referral:  Clinical Social Work  Discharge planning Services  CM Consult  Post Acute Care Choice:    Choice offered to:     DME Arranged:    DME Agency:     HH Arranged:    Industry Agency:     Status of Service:  Completed, signed off  If discussed at [employer], dates discussed:    Additional Comments:  Sharin Mons, RN 06/24/2017, 4:10 PM

## 2017-06-24 NOTE — NC FL2 (Signed)
[place] LEVEL OF CARE SCREENING TOOL     IDENTIFICATION  Patient Name: Raymond Moreno Birthdate: 04-07-1956 Sex: male Admission Date (Current Location): 06/22/2017  [facility] and [place] Number:  Herbalist and Address:  [facility] [place], [place] [address] [place], [place] [address]      Provider Number: [id number]  Attending Physician Name and Address:  Axel Filler, *  Relative Name and Phone Number:       Current Level of Care: Hospital Recommended Level of Care: [place] Prior Approval Number:    Date Approved/Denied:   PASRR Number: [id number]  Discharge Plan: SNF    Current Diagnoses: Patient Active Problem List   Diagnosis Date Noted  . Macrocytic anemia 06/24/2017  . Tobacco abuse 06/24/2017  . Malnutrition of moderate degree 06/24/2017  . Pneumonia 06/22/2017  . Tremor of both hands 01/07/2015  . Vision loss of left eye 01/07/2015  . Smoker 01/07/2015  . Adult body mass index 37.0-37.9 01/04/2012  . HTN (hypertension) 01/04/2012  . Hyperlipemia 01/04/2012    Orientation RESPIRATION BLADDER Height & Weight     Self, Time, Situation, Place  Normal Continent Weight: 207 lb 6.4 oz (94.1 kg) Height:  6' (182.9 cm)  BEHAVIORAL SYMPTOMS/MOOD NEUROLOGICAL BOWEL NUTRITION STATUS      Continent Diet (see summary)  AMBULATORY STATUS COMMUNICATION OF NEEDS Skin   Extensive Assist Verbally Normal                       Personal Care Assistance Level of Assistance  Bathing, Dressing Bathing Assistance: Maximum assistance   Dressing Assistance: Maximum assistance     Functional Limitations Info             SPECIAL CARE FACTORS FREQUENCY  PT (By licensed PT), OT (By licensed OT)     PT Frequency: 5/wk OT Frequency: 5/wk            Contractures      Additional Factors Info  Code Status, Allergies Code Status Info: FULL Allergies Info: NKA           Current  Medications (06/24/2017):  This is the current hospital active medication list Current Facility-Administered Medications  Medication Dose Route Frequency Provider Last Rate Last Dose  . amoxicillin-clavulanate (AUGMENTIN) 875-125 MG per tablet 1 tablet  1 tablet Oral BID Thomasene Ripple, MD   1 tablet at 06/24/17 1251  . feeding supplement (BOOST / RESOURCE BREEZE) liquid 1 Container  1 Container Oral QHS Axel Filler, MD   1 Container at 06/23/17 2053  . feeding supplement (ENSURE ENLIVE) (ENSURE ENLIVE) liquid 237 mL  237 mL Oral BID BM Axel Filler, MD   237 mL at 06/24/17 1111  . folic acid (FOLVITE) tablet 1 mg  1 mg Oral Daily Molt, Bethany, DO   1 mg at 06/24/17 1110  . LORazepam (ATIVAN) tablet 1 mg  1 mg Oral Q6H PRN Molt, Bethany, DO       Or  . LORazepam (ATIVAN) injection 1 mg  1 mg Intravenous Q6H PRN Molt, Bethany, DO      . multivitamin with minerals tablet 1 tablet  1 tablet Oral Daily Molt, Bethany, DO   1 tablet at 06/24/17 1110  . ondansetron (ZOFRAN-ODT) disintegrating tablet 4 mg  4 mg Oral Q8H PRN Hoffman, Jessica Ratliff, DO      . pantoprazole (PROTONIX) EC tablet 40 mg  40 mg Oral  BID Kalman Shan Ratliff, DO   40 mg at 06/24/17 1110  . thiamine (VITAMIN B-1) tablet 100 mg  100 mg Oral Daily Molt, Bethany, DO   100 mg at 06/24/17 1110   Or  . thiamine (B-1) injection 100 mg  100 mg Intravenous Daily Molt, Bethany, DO         Discharge Medications: Please see discharge summary for a list of discharge medications.  Relevant Imaging Results:  Relevant Lab Results:   Additional Information SS#: [id number]  Jorge Ny, LCSW

## 2017-06-25 ENCOUNTER — Telehealth: Payer: Self-pay

## 2017-06-25 DIAGNOSIS — D539 Nutritional anemia, unspecified: Secondary | ICD-10-CM

## 2017-06-25 DIAGNOSIS — E440 Moderate protein-calorie malnutrition: Secondary | ICD-10-CM

## 2017-06-25 DIAGNOSIS — J181 Lobar pneumonia, unspecified organism: Principal | ICD-10-CM

## 2017-06-25 DIAGNOSIS — E8809 Other disorders of plasma-protein metabolism, not elsewhere classified: Secondary | ICD-10-CM

## 2017-06-25 DIAGNOSIS — F17200 Nicotine dependence, unspecified, uncomplicated: Secondary | ICD-10-CM

## 2017-06-25 DIAGNOSIS — Z6828 Body mass index (BMI) 28.0-28.9, adult: Secondary | ICD-10-CM

## 2017-06-25 DIAGNOSIS — F1020 Alcohol dependence, uncomplicated: Secondary | ICD-10-CM

## 2017-06-25 DIAGNOSIS — N179 Acute kidney failure, unspecified: Secondary | ICD-10-CM

## 2017-06-25 LAB — CBC
HCT: 26.3 % — ABNORMAL LOW (ref 39.0–52.0)
HEMOGLOBIN: 8.5 g/dL — AB (ref 13.0–17.0)
MCH: 35.3 pg — AB (ref 26.0–34.0)
MCHC: 32.3 g/dL (ref 30.0–36.0)
MCV: 109.1 fL — ABNORMAL HIGH (ref 78.0–100.0)
PLATELETS: 398 10*3/uL (ref 150–400)
RBC: 2.41 MIL/uL — AB (ref 4.22–5.81)
RDW: 20.2 % — ABNORMAL HIGH (ref 11.5–15.5)
WBC: 14.4 10*3/uL — AB (ref 4.0–10.5)

## 2017-06-25 LAB — BASIC METABOLIC PANEL
ANION GAP: 11 (ref 5–15)
BUN: 19 mg/dL (ref 6–20)
CO2: 25 mmol/L (ref 22–32)
Calcium: 8.4 mg/dL — ABNORMAL LOW (ref 8.9–10.3)
Chloride: 98 mmol/L — ABNORMAL LOW (ref 101–111)
Creatinine, Ser: 1.16 mg/dL (ref 0.61–1.24)
Glucose, Bld: 111 mg/dL — ABNORMAL HIGH (ref 65–99)
Potassium: 3.1 mmol/L — ABNORMAL LOW (ref 3.5–5.1)
SODIUM: 134 mmol/L — AB (ref 135–145)

## 2017-06-25 NOTE — Telephone Encounter (Signed)
Hospital TOC per Dr Berneice Gandy, discharge 06/25/2017, apt 07/02/2017.

## 2017-06-25 NOTE — Progress Notes (Signed)
   Subjective:  Patient was seen laying comfortably in bed this AM in no acute distress. Patient states that he was able to tolerate food and drink yesterday without nausea/vomiting. He continues to endorse overall weakness but states he will try to work with physical therapy today. He wants to go to inpatient SNF to work more on his strength.   Objective:  Vital signs in last 24 hours: Vitals:   06/24/17 1427 06/24/17 2134 06/25/17 0500 06/25/17 0539  BP: 102/71 1'08/72 99/63 99/63 '$  Pulse: 79 87 91 91  Resp: '18 17  18  '$ Temp: 98 F (36.7 C) 98.5 F (36.9 C) 98.2 F (36.8 C) 98.2 F (36.8 C)  TempSrc: Oral Oral Oral Oral  SpO2: 100% 97% 96% 96%  Weight:      Height:       Physical Exam  Constitutional: He appears well-developed and well-nourished. No distress.  HENT:  Oropharynx clear but dry  Cardiovascular: Normal rate, regular rhythm and intact distal pulses.  Exam reveals no friction rub.   No murmur heard. Pulmonary/Chest: Effort normal. No respiratory distress. He has no wheezes.  Abdominal: Soft. He exhibits no distension. There is no tenderness. There is no guarding.  Musculoskeletal: He exhibits no edema (of bilateral lower extremities) or tenderness (of bilateral lower extremities).  Skin: Skin is warm and dry. No rash noted. No erythema.   Assessment/Plan:  Principal Problem:   Pneumonia Active Problems:   Macrocytic anemia   Tobacco abuse   Malnutrition of moderate degree  Raymond Moreno is a 61 yo with a PMH of HTN and HLD who presented with a multi-week history of worsening fatigue, nausea, and cough. In the ED he was found to have an elevated lactate, AKI, and LLL infiltrate on CXR concerning for pneumonia. The patient was admitted to the internal medicine teaching service for admission. The specific problems addressed during admission were as follows:  Cavitary pneumonia, ZWC:HENIDPO'E chest imaging revealed cavitary pneumonia which will need to be followed  with CT scan in 3 months. Patient's lab work and overall clinical status improving with antibiotic therapy. Patient medically stable for discharge today. -PO Augmentin for total 3 week duration given cavitary nature of lung lesion -Chest CT in 3 months as outpatient to ensure resolution/no lung lesions  Elevated protein gap: Patient had elevated protein gap on admission labs (low albumin with normal total protein).  His corrected calcium was 10.2. Given his symptoms, studies for multiple myeloma sent. Kappa/Lambda chain ratio 1.76, which is not consistent with gamma-globinopathy. Serum immune fixation still pending. Will set up patient in [facility] clinic for follow up of studies and further.   Malaise, decreased appetite, unintentional weight loss:Patient continues to endorse overall malaise and weakness, but his overall tolerance of meals has continued to improve during admission. PT suggests SNF placement for rehabilitation and patient agreeable to this plan. -Ondansetron 4 mg PRN -PT evaluation recommending SNF, social work consulted for placement, their work is appreciated.  AKI (resolved): Patient's creatinine elevated to 1.76 on admission and decreased to baseline 1.16.   Macrocytic anemia: Likely secondary to chronic alcohol use and poor nutrition -Folic acid 1 mg and daily multivitamdaily  Hx of alcohol abuse:  -Continue CIWA  Fluids: None VTE prophylaxis: SCD's while in bed Diet:Regular  Dispo: Anticipated discharge today, pending SNF placement.  Thomasene Ripple, MD 06/25/2017, 10:54 AM Pager: [phone]

## 2017-06-25 NOTE — Discharge Summary (Signed)
Name: Raymond Moreno MRN: [id number] DOB: 1955-12-26 61 y.o. PCP: Shawnee Knapp, MD  Date of Admission: 06/22/2017  1:20 PM Date of Discharge: 06/26/2017 Attending Physician: Axel Filler, *  Discharge Diagnosis:  Principal Problem:   Pneumonia Active Problems:   Macrocytic anemia   Tobacco abuse   Malnutrition of moderate degree  Discharge Medications: Allergies as of 06/26/2017   No Known Allergies     Medication List    TAKE these medications   amLODipine 5 MG tablet Commonly known as:  NORVASC TAKE 1 TABLET BY MOUTH EVERY DAY   amoxicillin-clavulanate 875-125 MG tablet Commonly known as:  AUGMENTIN Take 1 tablet by mouth 2 (two) times daily.   aspirin 81 MG tablet Take 81 mg by mouth daily.   atorvastatin 40 MG tablet Commonly known as:  LIPITOR TAKE 1 TABLET BY MOUTH EVERY DAY   metoprolol succinate 50 MG 24 hr tablet Commonly known as:  TOPROL-XL TAKE 1 TABLET BY MOUTH DAILY. TAKE WITH OR IMMEDIATELY FOLLOWING A MEAL.       Disposition and follow-up:   Raymond Moreno was discharged from [facility] in Good condition.  At the hospital follow up visit please address:  1.  Patient admitted to the hospital with progressive fatigue, nausea/vomiting, and cough. Found to have cavitary left lower lobe pneumonia. He was given antibiotics with good clinical response in his cough. Patient will continue on Augmentin for total 3 week duration of therapy.   Patient discharged to SNF given progressive weakness/deconditioning 2/2 decreased appetite and overall malaise that was present on admission. Patient also endorsed unintentional weight loss, all of which was concerning for malignancy. PSA within normal limits, multiple myeloma workup reassuring, and CT chest did not reveal any nodules/lesions during hospitalization. Please consider additional screening for malignancy as outpatient.  2.  Labs / imaging needed at time of follow-up: CT  chest 3 months after discharge to evaluate for resolution of LLL cavitary pneumonia. Consider colonoscopy as outpatient to rule out colon cancer (no screening before)  3.  Pending labs/ test needing follow-up: Multiple Myeloma Panel  Follow-up Appointments: Follow-up Information    [place]. Go on 07/02/2017.   Why:  We have scheduled a hospital follow up appointment for Friday, October 26th at 10:45 am in the internal medicine clinic at [place]. Please follow up in this clinic for management of your chronic medical conditions. Contact information: [address] [place] [facility] Course by problem list: Principal Problem:   Pneumonia Active Problems:   Macrocytic anemia   Tobacco abuse   Malnutrition of moderate degree   Raymond Moreno is a 61 yo with a PMH of HTN, HLD, and chronic alcohol use (drinks 1 gallon vodka per week) who presented with a multi-week history of worsening fatigue, nausea, and cough. In the ED he was found to have an elevated lactate, AKI, and left lower lobe infiltrate on chest x ray consistent with pneumonia. The patient was admitted to the internal medicine teaching service for management. The specific problems addressed during admission were as follows:  Cavitary pneumonia, TKZ:SWFUXNA'T CXR on admission showed left lower lobe infiltrate and follow up CT scan revealed a cavitary lesion in this location. Given the patient's leukocytosis and elevated procalcitonin level on admission, it was most likely that this cavitary lesion represented a cavitary pneumonia. This type of lesion is mostly caused  by anaerobic infections, strep pneumonia, or klebsiella. We were most concerned with anaerobic infection given the patient's history of heavy alcohol use. The patient's labs and clinical status improved with initiation of antibiotic therapy and he was discharged with a total of 3 weeks of  Augmentin 1 tablet BID. It is recommend that the patient obtain repeat chest CT in 3 months to ensure resolution of the pneumonia and/or evaluate for underlying lung mass.   AKI: Patient's creatinine elevated to 1.76 on admission and returned to baseline around 1.1 with IVF during hospitalization.   Chronic alcohol use: Patient reported drinking 1 gallon of vodka per week on admission, with last drink two weeks prior to presentation. Patient on CIWA protocol while inpatient. There were no symptoms of withdrawal or alcohol withdrawal seizures throughout admission.  Hypoalbuminemia with elevated protein gap: Patient had elevated protein gap on admission labs and corrected calcium of 10.2. There was concern for multiple myeloma given the patient's endorsement of unintentional weight loss, decreased appetite, possible kidney dysfunction and overall malaise/weakness. The patient's kappa/lambda light chain analysis did not suggest multiple myeloma and were more consistent with infection, however definitive multiple myeloma panel pending upon discharge. Please follow up this study.   Macrocytic anemia: Patient's B12 within normal limits, RBC folate also within normal limits. Will discontinue folate supplementation on discharge. Chronic alcohol use, underlying liver disease, multiple myeloma, or hypothyroidism can cause macrocytic anemia.Patient planning to follow up with [facility] clinic on discharge, where workup can be continued as outpatient.    Malaise, decreased appetite, unintentional weight loss:Patient endorsed improvement in his symptoms of nausea/vomiting that initially brought him to the hospital throughout hospitalization. He tolerated all of his meals without any episodes of emesis. Patient found to have moderate protein malnutrition and appropriate supplementation was provided while inpatient. His symptoms on presentation were concerning for malignancy. His serum PSA was within normal limits, CT scan  revealed no masses, and initial multiple myeloma workup was reassuring. Would consider referral for colonoscopy as outpatient to continue to rule out malignancy as a cause of these symptoms.   Discharge Vitals:   BP 102/69 (BP Location: Left Arm)   Pulse 91   Temp 98.6 F (37 C) (Oral)   Resp 18   Ht 6' (1.829 m)   Wt 207 lb 6.4 oz (94.1 kg)   SpO2 97%   BMI 28.13 kg/m   Pertinent Labs, Studies, and Procedures:  BMP BMP Latest Ref Rng & Units 06/25/2017 06/24/2017 06/23/2017  Glucose 65 - 99 mg/dL 111(H) 100(H) 99  BUN 6 - 20 mg/dL 19 24(H) 34(H)  Creatinine 0.61 - 1.24 mg/dL 1.16 1.35(H) 1.61(H)  Sodium 135 - 145 mmol/L 134(L) 134(L) 131(L)  Potassium 3.5 - 5.1 mmol/L 3.1(L) 3.4(L) 3.9  Chloride 101 - 111 mmol/L 98(L) 98(L) 92(L)  CO2 22 - 32 mmol/L '25 25 27  ' Calcium 8.9 - 10.3 mg/dL 8.4(L) 8.6(L) 8.6(L)   CBC CBC Latest Ref Rng & Units 06/25/2017 06/24/2017 06/23/2017  WBC 4.0 - 10.5 K/uL 14.4(H) 15.7(H) 19.0(H)  Hemoglobin 13.0 - 17.0 g/dL 8.5(L) 8.8(L) 9.8(L)  Hematocrit 39.0 - 52.0 % 26.3(L) 27.3(L) 29.8(L)  Platelets 150 - 400 K/uL 398 435(H) 458(H)   Urinalysis    Component Value Date/Time   COLORURINE AMBER (A) 06/23/2017 0225   APPEARANCEUR HAZY (A) 06/23/2017 0225   LABSPEC 1.017 06/23/2017 0225   PHURINE 5.0 06/23/2017 0225   GLUCOSEU NEGATIVE 06/23/2017 0225   HGBUR NEGATIVE 06/23/2017 0225   BILIRUBINUR NEGATIVE 06/23/2017 0225  BILIRUBINUR negative 04/22/2016 1025   BILIRUBINUR neg 09/27/2012 1007   KETONESUR NEGATIVE 06/23/2017 0225   PROTEINUR NEGATIVE 06/23/2017 0225   UROBILINOGEN 1.0 04/22/2016 1025   UROBILINOGEN 0.2 12/31/2009 1330   NITRITE NEGATIVE 06/23/2017 0225   LEUKOCYTESUR NEGATIVE 06/23/2017 0225   Procaclitonin = 0.40, 0.27  HIV antibody = Nonreactive B12 = 368 RBC folate = 816  Kappa/lambda light chains   Ref Range & Units 3d ago  Kappa free light chain 3.3 - 19.4 mg/L 60.2    Lamda free light chains 5.7 - 26.3 mg/L 34.4      Kappa, lamda light chain ratio 0.26 - 1.65 1.75    Comment: (NOTE)  Performed At: [facility]  [place], [place] [id number]  Lindon Romp MD MA:[id number]          Discharge Instructions: Discharge Instructions    Call MD for:  difficulty breathing, headache or visual disturbances    Complete by:  As directed    Call MD for:  persistant nausea and vomiting    Complete by:  As directed    Call MD for:  temperature >100.4    Complete by:  As directed    Diet - low sodium heart healthy    Complete by:  As directed    Diet - low sodium heart healthy    Complete by:  As directed    Discharge instructions    Complete by:  As directed    You were evaluated for progressive weakness, malaise, nausea/vomiting, and cough. You were found to have a pneumonia which could be contributing to your symptoms. You will need to take a total of 3 weeks of antibiotic therapy after discharge from the hospital and follow up with the internal medicine clinic at [facility] after discharge to assess your symptoms (appointment scheduled for 07/02/2017).   Increase activity slowly    Complete by:  As directed    Increase activity slowly    Complete by:  As directed       Signed: Thomasene Ripple, MD 06/26/2017, 11:40 AM   Pager: [phone]

## 2017-06-25 NOTE — Progress Notes (Signed)
Patient refused any stool softener for constipation stating it is because he was not eating.

## 2017-06-25 NOTE — Evaluation (Signed)
Occupational Therapy Evaluation Patient Details Name: Raymond Moreno MRN: [id number] DOB: 08/24/1956 Today's Date: 06/25/2017    History of Present Illness Pt is a 61 y/o male admitted secondary to pneumonia. PMH includes HTN, L eye visionloss, tobacco abuse, alcohol abuse, and  R knee arthroscopy.    Clinical Impression   Pt admitted with the above diagnoses and presents with below problem list. Pt will benefit from continued acute OT to address the below listed deficits and maximize independence with basic ADLs prior to d/c to venue below. PTA pt was independent with ADLs. Pt is currently setup-supervision with UB ADLs, mod-max +2 physical assist with LB ADLs and functional transfers. Recommend further rehab at skilled care venue at d/c.       Follow Up Recommendations  SNF    Equipment Recommendations  Other (comment) (defer to next venue)    Recommendations for Other Services       Precautions / Restrictions Precautions Precautions: Fall Restrictions Weight Bearing Restrictions: No      Mobility Bed Mobility Overal bed mobility: Needs Assistance Bed Mobility: Supine to Sit     Supine to sit: Min assist     General bed mobility comments: increased time and effort. Assist to steady.  Transfers Overall transfer level: Needs assistance Equipment used: Rolling walker (2 wheeled) Transfers: Sit to/from [facility] Sit to Stand: Max assist;+2 physical assistance;From elevated surface Stand pivot transfers: Mod assist;+2 physical assistance       General transfer comment: from elevated bed height to recliner. Max A +2 to power to standing. then mod once pivoting.     Balance Overall balance assessment: Needs assistance Sitting-balance support: No upper extremity supported;Feet supported Sitting balance-Leahy Scale: Fair     Standing balance support: Bilateral upper extremity supported;During functional activity Standing balance-Leahy Scale:  Poor Standing balance comment: Reliant on RW and external assist to stand.                            ADL either performed or assessed with clinical judgement   ADL Overall ADL's : Needs assistance/impaired Eating/Feeding: Set up;Sitting   Grooming: Set up;Sitting   Upper Body Bathing: Set up;Sitting;Supervision/ safety   Lower Body Bathing: +2 for physical assistance;Sit to/from stand;Maximal assistance   Upper Body Dressing : Set up;Supervision/safety;Sitting   Lower Body Dressing: Maximal assistance;+2 for physical assistance;Sit to/from stand   Toilet Transfer: Moderate assistance;+2 for physical assistance;Stand-pivot;BSC;RW   Toileting- Clothing Manipulation and Hygiene: Moderate assistance;Maximal assistance;+2 for physical assistance;Sit to/from stand   Tub/ Shower Transfer: Maximal assistance;+2 for physical assistance;Stand-pivot;3 in 1;Shower seat;Rolling walker     General ADL Comments: Pt completed bed mobility and SPT from EOB>recliner as detailed above. most assist needed with powerup.     Vision         Perception     Praxis      Pertinent Vitals/Pain Pain Assessment: No/denies pain     Hand Dominance Right   Extremity/Trunk Assessment Upper Extremity Assessment Upper Extremity Assessment: Generalized weakness   Lower Extremity Assessment Lower Extremity Assessment: Defer to PT evaluation   Cervical / Trunk Assessment Cervical / Trunk Assessment: Kyphotic   Communication Communication Communication: No difficulties   Cognition Arousal/Alertness: Awake/alert Behavior During Therapy: WFL for tasks assessed/performed Overall Cognitive Status: Within Functional Limits for tasks assessed  General Comments       Exercises     Shoulder Instructions      Home Living Family/patient expects to be discharged to:: Private residence Living Arrangements: Alone Available Help at  Discharge: Other (Comment) (reports he has no one to check on him )) Type of Home: House Home Access: Stairs to enter [employer] of Steps: 2 Entrance Stairs-Rails: Left Home Layout: One level     Bathroom Shower/Tub: Tub only   Biochemist, clinical: Standard     Home Equipment: Crutches          Prior Functioning/Environment Level of Independence: Independent        Comments: Reports he was independent, however, was very limited in the distance he was able to walk.         OT Problem List: Decreased strength;Decreased activity tolerance;Impaired balance (sitting and/or standing);Decreased knowledge of use of DME or AE;Decreased knowledge of precautions      OT Treatment/Interventions: Self-care/ADL training;Therapeutic exercise;Energy conservation;DME and/or AE instruction;Therapeutic activities;Patient/family education;Balance training    OT Goals(Current goals can be found in the care plan section) Acute Rehab OT Goals Patient Stated Goal: to get stronger  OT Goal Formulation: With patient Time For Goal Achievement: 07/09/17 Potential to Achieve Goals: Good ADL Goals Pt Will Perform Lower Body Bathing: with min assist;sit to/from stand (+2) Pt Will Perform Lower Body Dressing: with min assist;sit to/from stand (+2) Pt Will Transfer to Toilet: with min assist;with +2 assist;stand pivot transfer;bedside commode Pt Will Perform Toileting - Clothing Manipulation and hygiene: with min assist;with 2+ total assist;sit to/from stand  OT Frequency: Min 2X/week   Barriers to D/C:            Co-evaluation              AM-PAC PT "6 Clicks" Daily Activity     Outcome Measure Help from another person eating meals?: None Help from another person taking care of personal grooming?: A Little Help from another person toileting, which includes using toliet, bedpan, or urinal?: A Lot Help from another person bathing (including washing, rinsing, drying)?: A Lot Help  from another person to put on and taking off regular upper body clothing?: A Little Help from another person to put on and taking off regular lower body clothing?: A Lot 6 Click Score: 16   End of Session Equipment Utilized During Treatment: Rolling walker Nurse Communication: Other (comment);Mobility status (no chair alarm box in room, pt not on bed alarm previously)  Activity Tolerance: Patient limited by fatigue;Patient tolerated treatment well Patient left: in chair;with call bell/phone within reach  OT Visit Diagnosis: Unsteadiness on feet (R26.81);Muscle weakness (generalized) (M62.81)                Time: 8657-8469 OT Time Calculation (min): 20 min Charges:  OT General Charges $OT Visit: 1 Visit OT Evaluation $OT Eval Low Complexity: 1 Low G-Codes:       Hortencia Pilar 06/25/2017, 12:35 PM

## 2017-06-26 DIAGNOSIS — E785 Hyperlipidemia, unspecified: Secondary | ICD-10-CM

## 2017-06-26 DIAGNOSIS — I10 Essential (primary) hypertension: Secondary | ICD-10-CM

## 2017-06-26 MED ORDER — AMOXICILLIN-POT CLAVULANATE 875-125 MG PO TABS: 1.0000 | ORAL_TABLET | Freq: Two times a day (BID) | ORAL | 0 refills | Status: AC

## 2017-06-26 NOTE — Progress Notes (Signed)
Patient will DC to: [facility] Anticipated DC date: 06/26/17 Family notified: Sister  Transport by: Glenna Fellows   Per MD patient ready for DC to [facility]. RN, patient, patient's family, and facility notified of DC. Discharge Summary sent to facility. RN given number for report [phone], leave a message if needed and they'll call back). DC packet on chart. Ambulance transport requested for patient.   CSW signing off.  Cedric Fishman, [place] Social Worker [phone]

## 2017-06-26 NOTE — Progress Notes (Signed)
   Subjective:  Patient was seen sitting comfortably on the edge of his bed eating breakfast this AM in no acute distress. The patient states that he continues to be able to tolerate meals in hospital without nausea and vomiting.   Objective:  Vital signs in last 24 hours: Vitals:   06/25/17 1328 06/25/17 1800 06/26/17 0006 06/26/17 0554  BP: 103/72 119/81 113/73 102/69  Pulse: (!) 58 81 96 91  Resp: 18  18 18   Temp: 98.4 F (36.9 C)  99.2 F (37.3 C) 98.6 F (37 C)  TempSrc: Oral  Oral Oral  SpO2: 100%  96% 97%  Weight:      Height:       Physical Exam  Constitutional:  Gentleman who appears older than stated age sitting comfortably in bed in no acute distress.  HENT:  Mouth/Throat: Oropharynx is clear and moist.  Cardiovascular: Normal rate, regular rhythm and intact distal pulses.   No murmur heard. Pulmonary/Chest: Effort normal. No respiratory distress. He has no wheezes.  No crackles appreciated  Abdominal: Soft. He exhibits no distension. There is no tenderness. There is no guarding.  Musculoskeletal: He exhibits no edema (of bilateral lower extremities) or tenderness (of bilateral lower extremities).  Skin: Skin is warm and dry. No erythema. No pallor.   Assessment/Plan:  Principal Problem:   Pneumonia Active Problems:   Macrocytic anemia   Tobacco abuse   Malnutrition of moderate degree  Raymond Moreno is a 61 yo with a PMH of HTN and HLD who presented with a multi-week history of worsening fatigue, nausea, and cough. In the ED he was found to have an elevated lactate, AKI, and left lower lobe infiltrate on CXR consistent with pneumonia. The patient was admitted to the internal medicine teaching service for management. The specific problems addressed during admission were as follows:  Cavitary pneumonia, EHO:[id number] medically stable for discharge. -PO Augmentin for total 3 week duration given cavitary nature of lung lesion -Chest CT in 3 months as outpatient  to ensure resolution/no lung lesions  Malaise, decreased appetite, unintentional weight loss:Patient continues to endorse weakness but has been eating food without nausea/vomiting during each meal while inpatient. PT/OT recommend SNF placement, a plan to which the patient is agreeable. We are currently working with social work to discharge to patient-selected facility today.  -Ondansetron 4 mg PRN with meals for nausea -Discharge to SNF today, appreciate social work assistance  Macrocytic anemia: Likely secondary to chronic alcohol use and poor nutrition. B12 within normal limits, RBC folate also within normal limits. Will discontinue folate supplementation on discharge. Chronic alcohol use and underlying liver disease can cause macrocytic anemia. Patient planning to follow up with [facility] clinic on discharge, where workup can be continued as outpatient.    Hx of alcohol abuse:  -Continue CIWA  Fluids: None VTE prophylaxis: SCD's while in bed Diet:Regular  Dispo: Anticipated discharge pending SNF placement.   Thomasene Ripple, MD 06/26/2017, 6:54 AM Pager: [phone]

## 2017-06-26 NOTE — Clinical Social Work Placement (Signed)
   CLINICAL SOCIAL WORK PLACEMENT  NOTE  Date:  06/26/2017  Patient Details  Name: Raymond Moreno MRN: [id number] Date of Birth: [birth year]  Clinical Social Work is seeking post-discharge placement for this patient at the [place] level of care (*CSW will initial, date and re-position this form in  chart as items are completed):  Yes   Patient/family provided with [facility] Work Department's list of facilities offering this level of care within the geographic area requested by the patient (or if unable, by the patient's family).  Yes   Patient/family informed of their freedom to choose among providers that offer the needed level of care, that participate in Medicare, Medicaid or managed care program needed by the patient, have an available bed and are willing to accept the patient.  Yes   Patient/family informed of [facility]'s ownership interest in [facility] and [facility], as well as of the fact that they are under no obligation to receive care at these facilities.  PASRR submitted to EDS on 06/24/17     PASRR number received on 06/24/17     Existing PASRR number confirmed on       FL2 transmitted to all facilities in geographic area requested by pt/family on 06/24/17     FL2 transmitted to all facilities within larger geographic area on       Patient informed that his/her managed care company has contracts with or will negotiate with certain facilities, including the following:        Yes   Patient/family informed of bed offers received.  Patient chooses bed at [facility]     Physician recommends and patient chooses bed at      Patient to be transferred to [facility] on 06/26/17.  Patient to be transferred to facility by [facility]     Patient family notified on 06/26/17 of transfer.  Name of family member notified:  Beth     PHYSICIAN       Additional Comment:    _______________________________________________ Benard Halsted, [place] 06/26/2017, 11:43 AM

## 2017-06-26 NOTE — Progress Notes (Signed)
NURSING PROGRESS NOTE  Raymond Moreno [id number] Discharge Data: 06/26/2017 2:45 PM Attending Provider: Axel Filler, * TYO:MAYO, Laurey Arrow, MD   [name] to be D/C'd Nursing Home per MD order. Report given to [place] at [facility].    All IV's will be discontinued and monitored for bleeding.  All belongings will be returned to patient for patient to take home.  Last Documented Vital Signs:  Blood pressure 102/69, pulse 91, temperature 98.6 F (37 C), temperature source Oral, resp. rate 18, height 6' (1.829 m), weight 94.1 kg (207 lb 6.4 oz), SpO2 97 %.  Joslyn Hy, MSN, RN, [employer]

## 2017-06-26 NOTE — Care Management Note (Signed)
Case Management Note  Patient Details  Name: Raymond Moreno MRN: [id number] Date of Birth: Mar 22, 1956  Subjective/Objective:    Noted CM consult for Med assit for Augmentin to be delivered to bedside prior to discharge. PT is recommending SNF. If pt is discharged to SNF no assist will be needed. If discharged to home CM will provide MATCH assist.                 Action/Plan: CM will follow closely for disposition/discharge needs.    Expected Discharge Date:                  Expected Discharge Plan:  Skilled Nursing Facility  In-House Referral:  Clinical Social Work  Discharge planning Services  CM Consult  Post Acute Care Choice:    Choice offered to:     DME Arranged:    DME Agency:     HH Arranged:    [place] Agency:     Status of Service:  Completed, signed off  If discussed at [employer], dates discussed:    Additional Comments:  Delrae Sawyers, RN 06/26/2017, 9:14 AM

## 2017-06-28 ENCOUNTER — Other Ambulatory Visit: Payer: Self-pay | Admitting: Physician Assistant

## 2017-06-28 LAB — MULTIPLE MYELOMA PANEL, SERUM
ALBUMIN SERPL ELPH-MCNC: 2.4 g/dL — AB (ref 2.9–4.4)
ALPHA 1: 0.4 g/dL (ref 0.0–0.4)
ALPHA2 GLOB SERPL ELPH-MCNC: 1.1 g/dL — AB (ref 0.4–1.0)
Albumin/Glob SerPl: 0.7 (ref 0.7–1.7)
B-GLOBULIN SERPL ELPH-MCNC: 1.1 g/dL (ref 0.7–1.3)
GAMMA GLOB SERPL ELPH-MCNC: 0.9 g/dL (ref 0.4–1.8)
GLOBULIN, TOTAL: 3.5 g/dL (ref 2.2–3.9)
IgA: 335 mg/dL (ref 61–437)
IgG (Immunoglobin G), Serum: 909 mg/dL (ref 700–1600)
IgM (Immunoglobulin M), Srm: 81 mg/dL (ref 20–172)
TOTAL PROTEIN ELP: 5.9 g/dL — AB (ref 6.0–8.5)

## 2017-07-02 ENCOUNTER — Ambulatory Visit: Payer: Self-pay

## 2017-07-15 ENCOUNTER — Encounter: Payer: Self-pay | Admitting: Podiatry

## 2017-07-15 ENCOUNTER — Ambulatory Visit (INDEPENDENT_AMBULATORY_CARE_PROVIDER_SITE_OTHER): Payer: BC Managed Care – PPO | Admitting: Podiatry

## 2017-07-15 VITALS — BP 125/79 | HR 84 | Ht 72.0 in | Wt 226.0 lb

## 2017-07-15 DIAGNOSIS — M79676 Pain in unspecified toe(s): Secondary | ICD-10-CM | POA: Diagnosis not present

## 2017-07-15 DIAGNOSIS — B351 Tinea unguium: Secondary | ICD-10-CM

## 2017-07-15 DIAGNOSIS — M79609 Pain in unspecified limb: Principal | ICD-10-CM

## 2017-07-15 NOTE — Progress Notes (Signed)
  Subjective:  Patient ID: Raymond Moreno, male    DOB: April 06, 1956,  MRN: [id number]  Chief Complaint  Patient presents with  . Toe Pain    bilateral elongated thickened toenails   61 y.o. male presents with the above complaint. Reports painful, thickened, long nails that hurt when he wears shoes. Denies DM.   Past Medical History:  Diagnosis Date  . Arthritis    "knees" (06/23/2017)  . Hyperlipidemia   . Hypertension   . Pneumonia 06/22/2017   Past Surgical History:  Procedure Laterality Date  . BUNIONECTOMY Left 1990s   Archie Endo 01/01/2010  . KNEE ARTHROSCOPY Right    "removed cyst too"/notes 01/01/2010    Current Outpatient Medications:  .  amLODipine (NORVASC) 5 MG tablet, TAKE 1 TABLET BY MOUTH EVERY DAY, Disp: 15 tablet, Rfl: 0 .  aspirin 81 MG tablet, Take 81 mg by mouth daily., Disp: , Rfl:  .  atorvastatin (LIPITOR) 40 MG tablet, TAKE 1 TABLET BY MOUTH EVERY DAY (Patient not taking: Reported on 06/22/2017), Disp: 30 tablet, Rfl: 0 .  metoprolol succinate (TOPROL-XL) 50 MG 24 hr tablet, TAKE 1 TABLET BY MOUTH DAILY. TAKE WITH OR IMMEDIATELY FOLLOWING A MEAL., Disp: 15 tablet, Rfl: 0  No Known Allergies Review of Systems Objective:   Vitals:   07/15/17 1040  BP: 125/79  Pulse: 84   General AA&O x3. Normal mood and affect.  Vascular Dorsalis pedis and posterior tibial pulses  present 2+ bilaterally  Capillary refill normal to all digits. Pedal hair growth normal.  Neurologic Epicritic sensation grossly present.  Dermatologic No open lesions. Interspaces clear of maceration. Nails elongated and thickened with pain to palpation  Orthopedic: MMT 5/5 in dorsiflexion, plantarflexion, inversion, and eversion. Normal joint ROM without pain or crepitus.    Assessment & Plan:  Patient was evaluated and treated and all questions answered.  Onychomycosis with Pain -Nails debrided x10 -Advised he does not meet criteria for routine care.  Procedure: Nail  Debridement Rationale: pain Type of Debridement: manual, sharp debridement. Instrumentation: Nail nipper, rotary burr. Number of Nails: 10    Return if symptoms worsen or fail to improve.

## 2019-06-29 IMAGING — CT CT CHEST W/ CM
2 of 3 series · 15 of 36 positions shown, 18 images · IV contrast (APPLIED)
Comparison: 06/22/2017 chest radiograph.

CLINICAL DATA: Inpatient.  Dyspnea.  Chest congestion.

EXAM:
CT CHEST WITH CONTRAST
TECHNIQUE: Multidetector CT imaging of the chest was performed during
intravenous contrast administration.
CONTRAST:  75mL [id number] IOPAMIDOL ([id number]) INJECTION 61%

[Series 3: thorax 2.0 i31f 2 · axial · 0.84mm/px · z∈[+1139,+1373]mm · 12 of 139 slices shown, 15 images]
[im 11/139  mediastinal]
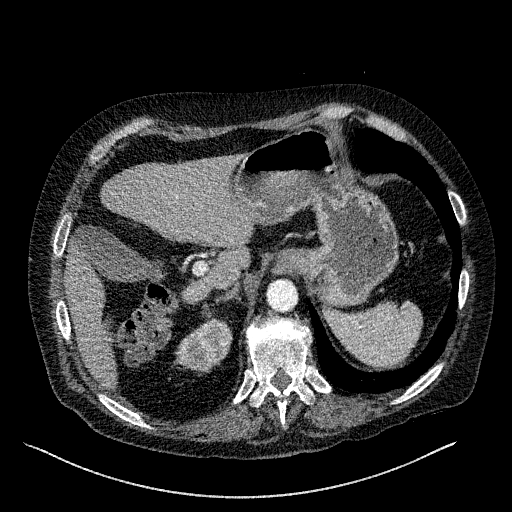
[im 11/139  lung]
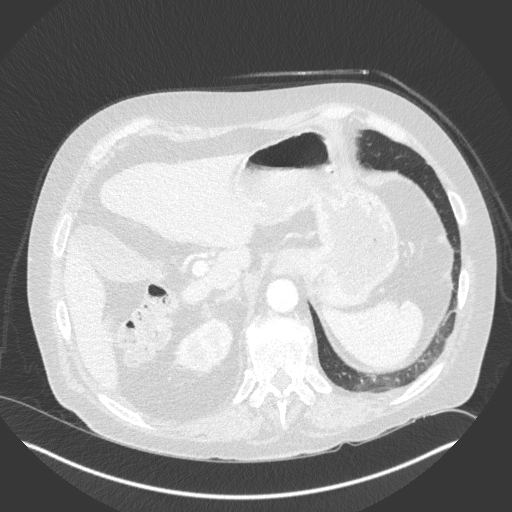
[im 21/139  lung]
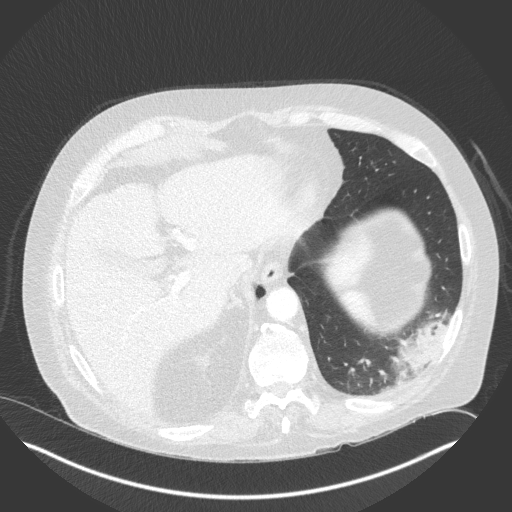
[im 31/139  lung]
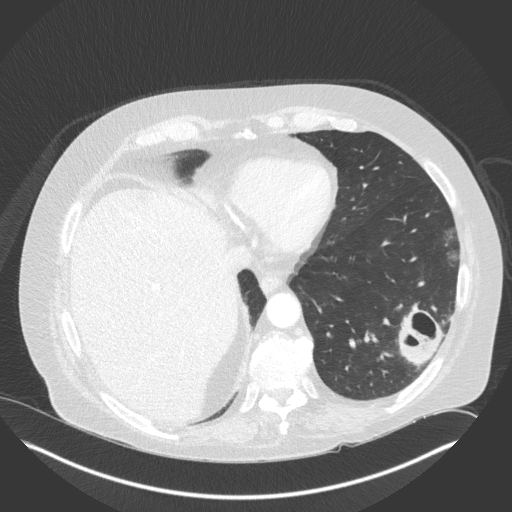
[im 41/139  lung]
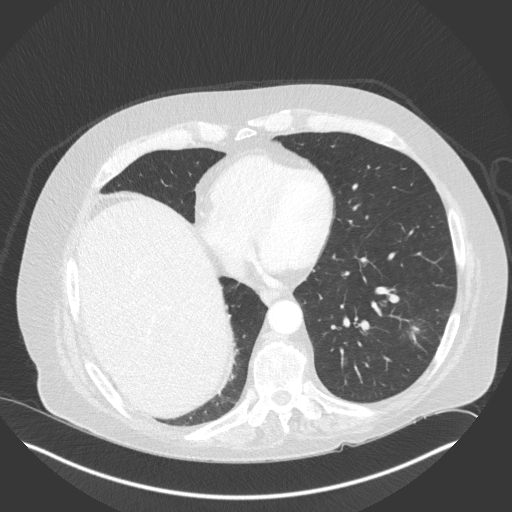
[im 52/139  mediastinal]
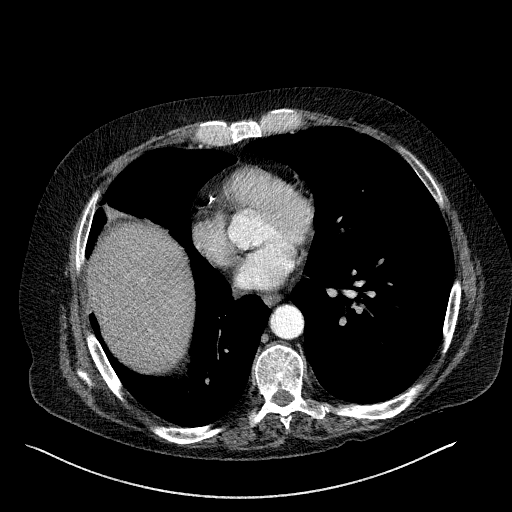
[im 52/139  lung]
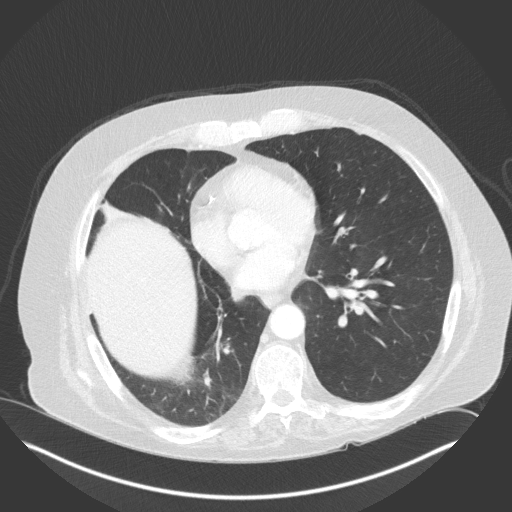
[im 62/139  lung]
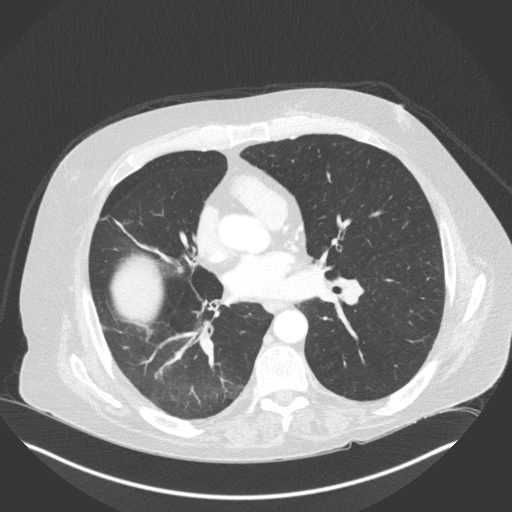
[im 77/139  lung]
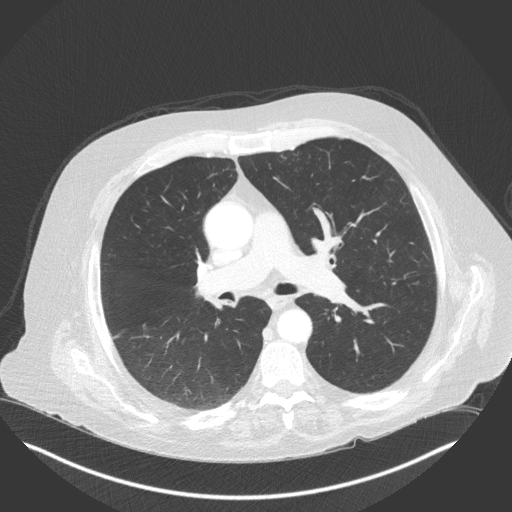
[im 87/139  lung]
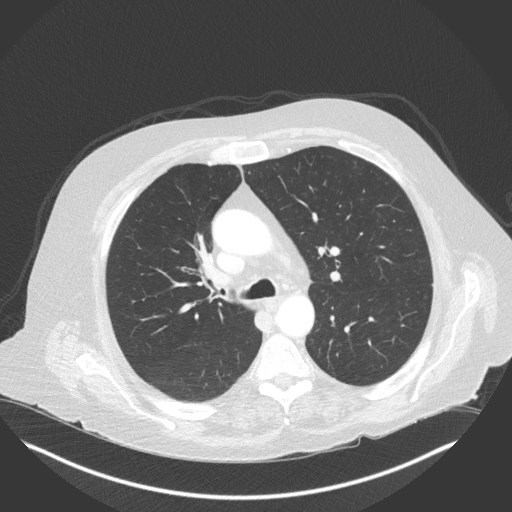
[im 98/139  mediastinal]
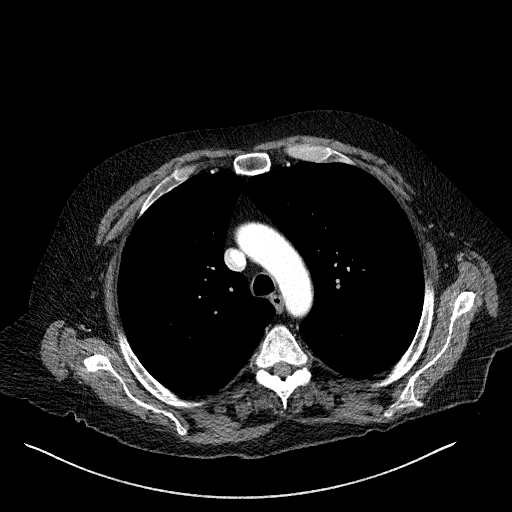
[im 98/139  lung]
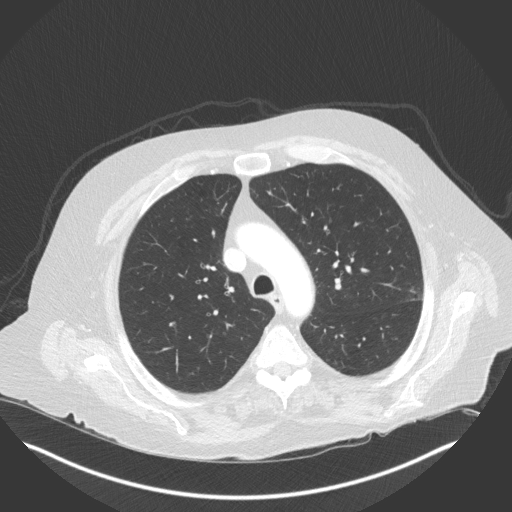
[im 108/139  lung]
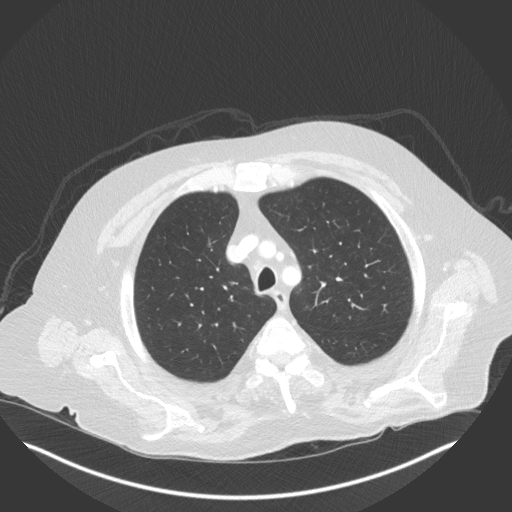
[im 118/139  lung]
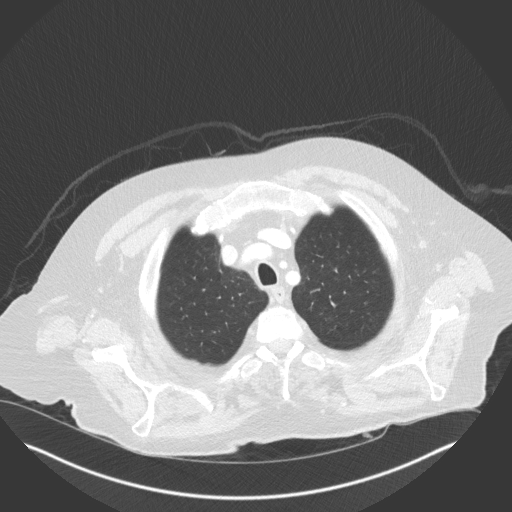
[im 128/139  lung]
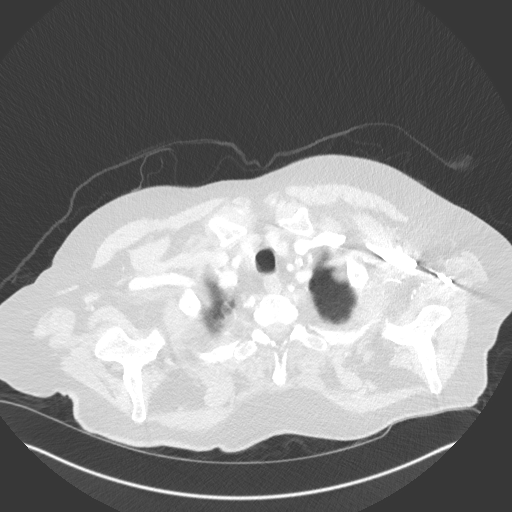

[Series 5: coronal · coronal · 0.59mm/px · 3 of 135 slices shown]
[im 27/135  lung]
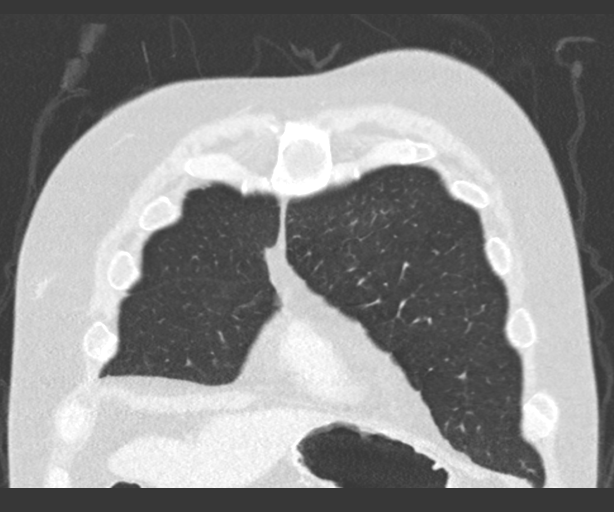
[im 54/135  lung]
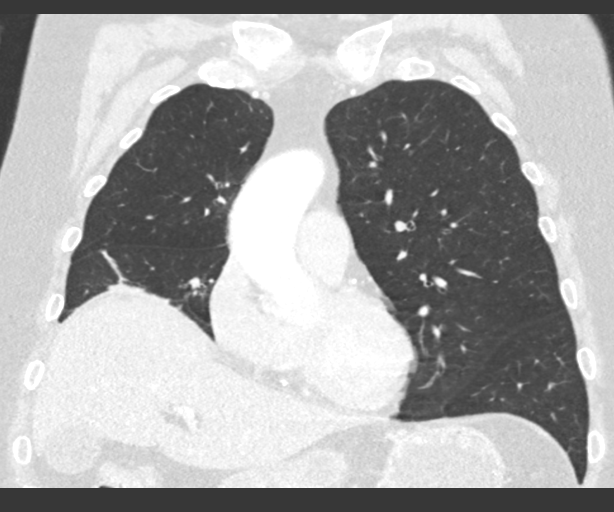
[im 81/135  lung]
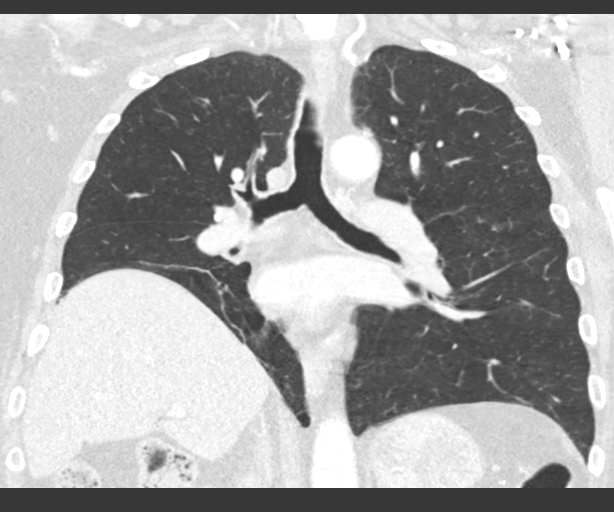

[15 of 36 positions shown; findings below may reference images not displayed]

FINDINGS: Cardiovascular: Normal heart size. No significant pericardial
fluid/thickening. Left anterior descending, left circumflex and
right coronary atherosclerosis. Atherosclerotic nonaneurysmal
thoracic aorta. Normal caliber pulmonary arteries. No central
pulmonary emboli.

Mediastinum/Nodes: No discrete thyroid nodules. Unremarkable
esophagus. No pathologically enlarged axillary, mediastinal or hilar
lymph nodes.

Lungs/Pleura: No pneumothorax. No pleural effusion. Subsegmental
dependent right middle lobe and right lower lobe atelectasis. There
is focal airspace consolidation in the peripheral left lower lobe
measuring 4.9 x 4.0 cm with significant internal cavitation and
surrounding patchy ground-glass opacities. Otherwise no significant
pulmonary nodules. Diffuse bronchial wall thickening. Mild to
moderate elevation of the right hemidiaphragm.

Upper abdomen: Simple exophytic 1.5 cm anterior upper right renal
cyst.

Musculoskeletal: No aggressive appearing focal osseous lesions. Mild
thoracic spondylosis.
IMPRESSION: 1. Focal airspace consolidation in the peripheral left lower lobe
with internal cavitary change and surrounding patchy ground-glass
opacity. Cavitary pneumonia is suspected. Follow-up post treatment
chest CT advised in 2-3 months to exclude an underlying neoplasm.
2. No thoracic adenopathy.
3. Diffuse bronchial wall thickening suggestive of chronic
bronchitis.
4. Mild-to-moderate elevation of the right hemidiaphragm with
associated mild right basilar atelectasis.
5. Three-vessel coronary atherosclerosis.

Aortic Atherosclerosis ([id number]).

## 2019-08-11 ENCOUNTER — Other Ambulatory Visit: Payer: Self-pay

## 2019-08-11 DIAGNOSIS — Z20822 Contact with and (suspected) exposure to covid-19: Secondary | ICD-10-CM

## 2019-08-14 LAB — NOVEL CORONAVIRUS, NAA: SARS-CoV-2, NAA: NOT DETECTED
